# Patient Record
Sex: Female | Born: 1975 | Race: White | Hispanic: No | Marital: Married | Smoking: Never smoker
Health system: Southern US, Community
[De-identification: ages and names within clinical notes are randomized; demographics above are authoritative.]

## PROBLEM LIST (undated history)

## (undated) ENCOUNTER — Inpatient Hospital Stay (HOSPITAL_COMMUNITY): Payer: Self-pay

## (undated) ENCOUNTER — Inpatient Hospital Stay (HOSPITAL_COMMUNITY)

## (undated) ENCOUNTER — Inpatient Hospital Stay (HOSPITAL_COMMUNITY): Payer: BLUE CROSS/BLUE SHIELD

## (undated) DIAGNOSIS — Z8489 Family history of other specified conditions: Secondary | ICD-10-CM

## (undated) DIAGNOSIS — F32A Depression, unspecified: Secondary | ICD-10-CM

## (undated) DIAGNOSIS — R55 Syncope and collapse: Secondary | ICD-10-CM

## (undated) DIAGNOSIS — Z8619 Personal history of other infectious and parasitic diseases: Secondary | ICD-10-CM

## (undated) DIAGNOSIS — Z9889 Other specified postprocedural states: Secondary | ICD-10-CM

## (undated) DIAGNOSIS — I10 Essential (primary) hypertension: Secondary | ICD-10-CM

## (undated) DIAGNOSIS — R519 Headache, unspecified: Secondary | ICD-10-CM

## (undated) DIAGNOSIS — R002 Palpitations: Secondary | ICD-10-CM

## (undated) DIAGNOSIS — O09529 Supervision of elderly multigravida, unspecified trimester: Secondary | ICD-10-CM

## (undated) DIAGNOSIS — R011 Cardiac murmur, unspecified: Secondary | ICD-10-CM

## (undated) DIAGNOSIS — K429 Umbilical hernia without obstruction or gangrene: Secondary | ICD-10-CM

## (undated) DIAGNOSIS — R51 Headache: Secondary | ICD-10-CM

## (undated) DIAGNOSIS — F329 Major depressive disorder, single episode, unspecified: Secondary | ICD-10-CM

## (undated) HISTORY — DX: Syncope and collapse: R55

## (undated) HISTORY — PX: WISDOM TOOTH EXTRACTION: SHX21

## (undated) HISTORY — DX: Palpitations: R00.2

## (undated) HISTORY — PX: DILATION AND CURETTAGE OF UTERUS: SHX78

## (undated) HISTORY — PX: ABLATION: SHX5711

---

## 2010-04-15 ENCOUNTER — Ambulatory Visit: Payer: Self-pay | Admitting: Cardiology

## 2010-04-21 ENCOUNTER — Ambulatory Visit (HOSPITAL_COMMUNITY): Admission: RE | Admit: 2010-04-21 | Discharge: 2010-04-21 | Payer: Self-pay | Admitting: Cardiology

## 2010-04-21 ENCOUNTER — Ambulatory Visit: Payer: Self-pay | Admitting: Cardiology

## 2010-05-18 ENCOUNTER — Ambulatory Visit: Payer: Self-pay | Admitting: Cardiology

## 2011-10-10 ENCOUNTER — Other Ambulatory Visit: Payer: Self-pay | Admitting: Gynecology

## 2011-10-10 DIAGNOSIS — R928 Other abnormal and inconclusive findings on diagnostic imaging of breast: Secondary | ICD-10-CM

## 2011-10-11 ENCOUNTER — Ambulatory Visit
Admission: RE | Admit: 2011-10-11 | Discharge: 2011-10-11 | Disposition: A | Discharge status: Home / Self Care | Payer: 59 | Source: Ambulatory Visit | Attending: Gynecology | Admitting: Gynecology

## 2011-10-11 ENCOUNTER — Other Ambulatory Visit: Payer: Self-pay | Admitting: Gynecology

## 2011-10-11 DIAGNOSIS — R928 Other abnormal and inconclusive findings on diagnostic imaging of breast: Secondary | ICD-10-CM

## 2011-10-11 DIAGNOSIS — N6310 Unspecified lump in the right breast, unspecified quadrant: Secondary | ICD-10-CM

## 2011-10-14 ENCOUNTER — Other Ambulatory Visit: Payer: Self-pay | Admitting: Gynecology

## 2011-10-14 ENCOUNTER — Ambulatory Visit
Admission: RE | Admit: 2011-10-14 | Discharge: 2011-10-14 | Disposition: A | Discharge status: Home / Self Care | Payer: 59 | Source: Ambulatory Visit | Attending: Gynecology | Admitting: Gynecology

## 2011-10-14 DIAGNOSIS — N6310 Unspecified lump in the right breast, unspecified quadrant: Secondary | ICD-10-CM

## 2011-10-14 HISTORY — PX: BREAST BIOPSY: SHX20

## 2011-12-16 LAB — OB RESULTS CONSOLE HIV ANTIBODY (ROUTINE TESTING): HIV: NONREACTIVE

## 2011-12-16 LAB — OB RESULTS CONSOLE ABO/RH

## 2011-12-16 LAB — OB RESULTS CONSOLE ANTIBODY SCREEN: Antibody Screen: NEGATIVE

## 2011-12-16 LAB — OB RESULTS CONSOLE GC/CHLAMYDIA: Gonorrhea: NEGATIVE

## 2012-05-21 ENCOUNTER — Other Ambulatory Visit: Payer: Self-pay | Admitting: Gynecology

## 2012-05-21 DIAGNOSIS — N63 Unspecified lump in unspecified breast: Secondary | ICD-10-CM

## 2012-05-30 ENCOUNTER — Ambulatory Visit
Admission: RE | Admit: 2012-05-30 | Discharge: 2012-05-30 | Disposition: A | Discharge status: Home / Self Care | Payer: 59 | Source: Ambulatory Visit | Attending: Gynecology | Admitting: Gynecology

## 2012-05-30 DIAGNOSIS — N63 Unspecified lump in unspecified breast: Secondary | ICD-10-CM

## 2012-06-19 LAB — OB RESULTS CONSOLE GBS: GBS: POSITIVE

## 2012-06-20 ENCOUNTER — Encounter: Payer: Self-pay | Admitting: Cardiology

## 2012-07-21 ENCOUNTER — Inpatient Hospital Stay (HOSPITAL_COMMUNITY)
Admission: AD | Admit: 2012-07-21 | Discharge: 2012-07-24 | DRG: 766 | Disposition: A | Discharge status: Home / Self Care | Payer: 59 | Source: Ambulatory Visit | Attending: Obstetrics and Gynecology | Admitting: Obstetrics and Gynecology

## 2012-07-21 ENCOUNTER — Encounter (HOSPITAL_COMMUNITY): Payer: Self-pay

## 2012-07-21 DIAGNOSIS — O328XX0 Maternal care for other malpresentation of fetus, not applicable or unspecified: Principal | ICD-10-CM | POA: Diagnosis present

## 2012-07-21 DIAGNOSIS — O99892 Other specified diseases and conditions complicating childbirth: Secondary | ICD-10-CM | POA: Diagnosis present

## 2012-07-21 DIAGNOSIS — O09519 Supervision of elderly primigravida, unspecified trimester: Secondary | ICD-10-CM | POA: Diagnosis present

## 2012-07-21 DIAGNOSIS — O4290 Premature rupture of membranes, unspecified as to length of time between rupture and onset of labor, unspecified weeks of gestation: Secondary | ICD-10-CM | POA: Diagnosis present

## 2012-07-21 DIAGNOSIS — Z22330 Carrier of Group B streptococcus: Secondary | ICD-10-CM

## 2012-07-21 LAB — CBC
HCT: 35.4 % — ABNORMAL LOW (ref 36.0–46.0)
Platelets: 207 10*3/uL (ref 150–400)
RDW: 13.2 % (ref 11.5–15.5)
WBC: 13.6 10*3/uL — ABNORMAL HIGH (ref 4.0–10.5)

## 2012-07-21 MED ORDER — LIDOCAINE HCL (PF) 1 % IJ SOLN: 30.0000 mL | INTRAMUSCULAR | Status: DC | PRN

## 2012-07-21 MED ORDER — CITRIC ACID-SODIUM CITRATE 334-500 MG/5ML PO SOLN
30.0000 mL | ORAL | Status: DC | PRN
  Administered 2012-07-22: 30 mL via ORAL
  Filled 2012-07-21: qty 15

## 2012-07-21 MED ORDER — PENICILLIN G POTASSIUM 5000000 UNITS IJ SOLR
2.5000 10*6.[IU] | INTRAMUSCULAR | Status: DC
  Administered 2012-07-21 – 2012-07-22 (×4): 2.5 10*6.[IU] via INTRAVENOUS
  Filled 2012-07-21 (×6): qty 2.5

## 2012-07-21 MED ORDER — PENICILLIN G POTASSIUM 5000000 UNITS IJ SOLR
5.0000 10*6.[IU] | Freq: Once | INTRAVENOUS | Status: AC
  Administered 2012-07-21: 5 10*6.[IU] via INTRAVENOUS
  Filled 2012-07-21: qty 5

## 2012-07-21 MED ORDER — IBUPROFEN 600 MG PO TABS: 600.0000 mg | ORAL_TABLET | Freq: Four times a day (QID) | ORAL | Status: DC | PRN

## 2012-07-21 MED ORDER — LACTATED RINGERS IV SOLN
INTRAVENOUS | Status: DC
  Administered 2012-07-21: 16:00:00 via INTRAVENOUS

## 2012-07-21 MED ORDER — BUTORPHANOL TARTRATE 1 MG/ML IJ SOLN: 1.0000 mg | INTRAMUSCULAR | Status: DC | PRN

## 2012-07-21 MED ORDER — OXYTOCIN 40 UNITS IN LACTATED RINGERS INFUSION - SIMPLE MED: 62.5000 mL/h | INTRAVENOUS | Status: DC

## 2012-07-21 MED ORDER — ACETAMINOPHEN 325 MG PO TABS: 650.0000 mg | ORAL_TABLET | ORAL | Status: DC | PRN

## 2012-07-21 MED ORDER — OXYTOCIN 40 UNITS IN LACTATED RINGERS INFUSION - SIMPLE MED
1.0000 m[IU]/min | INTRAVENOUS | Status: DC
  Administered 2012-07-21: 2 m[IU]/min via INTRAVENOUS
  Filled 2012-07-21: qty 1000

## 2012-07-21 MED ORDER — ONDANSETRON HCL 4 MG/2ML IJ SOLN: 4.0000 mg | Freq: Four times a day (QID) | INTRAMUSCULAR | Status: DC | PRN

## 2012-07-21 MED ORDER — OXYCODONE-ACETAMINOPHEN 5-325 MG PO TABS: 1.0000 | ORAL_TABLET | ORAL | Status: DC | PRN

## 2012-07-21 MED ORDER — OXYTOCIN BOLUS FROM INFUSION: 500.0000 mL | INTRAVENOUS | Status: DC

## 2012-07-21 MED ORDER — LACTATED RINGERS IV SOLN: 500.0000 mL | INTRAVENOUS | Status: DC | PRN

## 2012-07-21 MED ORDER — TERBUTALINE SULFATE 1 MG/ML IJ SOLN: 0.2500 mg | Freq: Once | INTRAMUSCULAR | Status: AC | PRN

## 2012-07-21 MED ORDER — FLEET ENEMA 7-19 GM/118ML RE ENEM: 1.0000 | ENEMA | Freq: Every day | RECTAL | Status: DC | PRN

## 2012-07-21 NOTE — Progress Notes (Signed)
Pt reports to L+D with c/o SROM at midnight last night.  Reports clear fluid at first but now, light green noted on pads. SVE completed and fern slide positive. Pt with birth plan and reviewed with pt, FOB and doula.  The need for an IV discussed due to GBS status and pt in agreement.  Desires heplock between ABX dosing. Pt expressed fear of pitocin usage during labor due to an article she found online stating pitocin causes autism.  Education given to pt and family regarding pitocin policy here at [facility].  Dr Rana Snare made aware.

## 2012-07-21 NOTE — MAU Note (Signed)
Patient states she started leaking clear fluid at 0015. States the fluid now has a "murky" greenish color. States she was having contractions earlier but none now. Reports good fetal movement.

## 2012-07-21 NOTE — Progress Notes (Signed)
Pt desires to be off monitors.

## 2012-07-21 NOTE — H&P (Signed)
Kristin Kristin Church is a 36 y.o. female presenting for PROM at Midnight.  Presented at 1400 with Doula and birthing plan.  GBS+ and otherwise normal pregnancy. History OB History    Grav Para Term Preterm Abortions TAB SAB Ect Mult Living   1              Past Medical History  Diagnosis Date  . Palpitations     Of questionable significance  . Pre-syncope     Probably multifactorial and long standing in duration   No past surgical history on file. Family History: family history includes Arthritis in her mother; Hypertension in her father and mother; and Mitral valve prolapse in her mother. Social History:  reports that she has never smoked. She does not have any smokeless tobacco history on file. She reports that she drinks alcohol. Her drug history not on file.   Prenatal Transfer Tool  Maternal Diabetes: No Genetic Screening: Normal Maternal Ultrasounds/Referrals: Normal Fetal Ultrasounds or other Referrals:  None Maternal Substance Abuse:  No Significant Maternal Medications:  None Significant Maternal Lab Results:  None Other Comments:  None  ROS  Dilation: 1 Effacement (%): 80 Station: -2 Exam by:: S Earl RN Blood pressure 120/75, pulse 73, temperature 98.5 F (36.9 Kristin Church), temperature source Oral, resp. rate 18, height 5\' 2"  (1.575 m), weight 78.926 kg (174 lb), SpO2 100.00%. Exam Physical Exam  Per RN positive fern and light meconium Prenatal labs: ABO, Rh:   Antibody:   Rubella:   RPR:    HBsAg:    HIV:    GBS:     Assessment/Plan: IUP at  40 3/7 PROM now 17 hours ago.  Meconium and GBS + Pt at first reluctant for IV and allowed for Antibiotics for GBS I had a lengthy discussion with patient, her husband, Doula and with RN present regarding my recommendations for pitocin for induction/augmentation of labor.  Pt is concerned about the use of pitocin and a link to autism in something she read.  I researched and found no link in peer reviewed journals and tried to  reassure the patient.  She is now willing to begin pitocin and antibiotics and understands that she is at risk for chorio, Kristin Kristin Church, sepsis due to PROM, but at this time there is no evidence of any fetal compromise. Kristin Kristin Church 07/21/2012, 5:26 PM

## 2012-07-21 NOTE — MAU Note (Signed)
Patient has a pad on with greenish fluid. Spoke with Urban Gibson, CN in BS and patient will go to room 162.

## 2012-07-22 ENCOUNTER — Inpatient Hospital Stay (HOSPITAL_COMMUNITY): Payer: 59 | Admitting: Anesthesiology

## 2012-07-22 ENCOUNTER — Encounter (HOSPITAL_COMMUNITY): Payer: Self-pay | Admitting: Anesthesiology

## 2012-07-22 ENCOUNTER — Encounter (HOSPITAL_COMMUNITY)
Admission: AD | Disposition: A | Discharge status: Home / Self Care | Payer: Self-pay | Source: Ambulatory Visit | Attending: Obstetrics and Gynecology

## 2012-07-22 ENCOUNTER — Encounter (HOSPITAL_COMMUNITY): Payer: Self-pay | Admitting: *Deleted

## 2012-07-22 SURGERY — Surgical Case
Anesthesia: Spinal

## 2012-07-22 MED ORDER — DIPHENHYDRAMINE HCL 25 MG PO CAPS: 25.0000 mg | ORAL_CAPSULE | ORAL | Status: DC | PRN

## 2012-07-22 MED ORDER — SCOPOLAMINE 1 MG/3DAYS TD PT72
1.0000 | MEDICATED_PATCH | Freq: Once | TRANSDERMAL | Status: DC
  Administered 2012-07-22: 1.5 mg via TRANSDERMAL

## 2012-07-22 MED ORDER — DIPHENHYDRAMINE HCL 50 MG/ML IJ SOLN: 12.5000 mg | INTRAMUSCULAR | Status: DC | PRN

## 2012-07-22 MED ORDER — SCOPOLAMINE 1 MG/3DAYS TD PT72
MEDICATED_PATCH | TRANSDERMAL | Status: AC
  Filled 2012-07-22: qty 1

## 2012-07-22 MED ORDER — NALBUPHINE SYRINGE 5 MG/0.5 ML
5.0000 mg | INJECTION | INTRAMUSCULAR | Status: DC | PRN
  Filled 2012-07-22: qty 1

## 2012-07-22 MED ORDER — ONDANSETRON HCL 4 MG/2ML IJ SOLN: 4.0000 mg | Freq: Three times a day (TID) | INTRAMUSCULAR | Status: DC | PRN

## 2012-07-22 MED ORDER — SIMETHICONE 80 MG PO CHEW: 80.0000 mg | CHEWABLE_TABLET | ORAL | Status: DC | PRN

## 2012-07-22 MED ORDER — KETOROLAC TROMETHAMINE 30 MG/ML IJ SOLN
INTRAMUSCULAR | Status: AC
  Filled 2012-07-22: qty 1

## 2012-07-22 MED ORDER — PRENATAL MULTIVITAMIN CH: 1.0000 | ORAL_TABLET | Freq: Every day | ORAL | Status: DC

## 2012-07-22 MED ORDER — DIPHENHYDRAMINE HCL 50 MG/ML IJ SOLN: 25.0000 mg | INTRAMUSCULAR | Status: DC | PRN

## 2012-07-22 MED ORDER — IBUPROFEN 600 MG PO TABS: 600.0000 mg | ORAL_TABLET | Freq: Four times a day (QID) | ORAL | Status: DC

## 2012-07-22 MED ORDER — BUPIVACAINE IN DEXTROSE 0.75-8.25 % IT SOLN
INTRATHECAL | Status: DC | PRN
  Administered 2012-07-22: 1.5 mL via INTRATHECAL

## 2012-07-22 MED ORDER — OXYTOCIN 40 UNITS IN LACTATED RINGERS INFUSION - SIMPLE MED: 62.5000 mL/h | INTRAVENOUS | Status: AC

## 2012-07-22 MED ORDER — TETANUS-DIPHTH-ACELL PERTUSSIS 5-2.5-18.5 LF-MCG/0.5 IM SUSP: 0.5000 mL | Freq: Once | INTRAMUSCULAR | Status: DC

## 2012-07-22 MED ORDER — KETOROLAC TROMETHAMINE 30 MG/ML IJ SOLN
30.0000 mg | Freq: Four times a day (QID) | INTRAMUSCULAR | Status: AC | PRN
  Administered 2012-07-22 – 2012-07-23 (×2): 30 mg via INTRAVENOUS
  Filled 2012-07-22: qty 1

## 2012-07-22 MED ORDER — FENTANYL CITRATE 0.05 MG/ML IJ SOLN: 25.0000 ug | INTRAMUSCULAR | Status: DC | PRN

## 2012-07-22 MED ORDER — METOCLOPRAMIDE HCL 5 MG/ML IJ SOLN: 10.0000 mg | Freq: Three times a day (TID) | INTRAMUSCULAR | Status: DC | PRN

## 2012-07-22 MED ORDER — DEXTROSE 5 % IV SOLN
2.0000 g | Freq: Once | INTRAVENOUS | Status: AC
  Administered 2012-07-22: 2 g via INTRAVENOUS
  Filled 2012-07-22: qty 2

## 2012-07-22 MED ORDER — PROMETHAZINE HCL 25 MG/ML IJ SOLN: 6.2500 mg | INTRAMUSCULAR | Status: DC | PRN

## 2012-07-22 MED ORDER — KETOROLAC TROMETHAMINE 30 MG/ML IJ SOLN: 30.0000 mg | Freq: Four times a day (QID) | INTRAMUSCULAR | Status: AC | PRN

## 2012-07-22 MED ORDER — SODIUM CHLORIDE 0.9 % IJ SOLN: 3.0000 mL | INTRAMUSCULAR | Status: DC | PRN

## 2012-07-22 MED ORDER — OXYCODONE-ACETAMINOPHEN 5-325 MG PO TABS
1.0000 | ORAL_TABLET | ORAL | Status: DC | PRN
  Administered 2012-07-24: 1 via ORAL
  Filled 2012-07-22: qty 1

## 2012-07-22 MED ORDER — FENTANYL CITRATE 0.05 MG/ML IJ SOLN
INTRAMUSCULAR | Status: DC | PRN
  Administered 2012-07-22: 25 ug via INTRATHECAL

## 2012-07-22 MED ORDER — PHENYLEPHRINE HCL 10 MG/ML IJ SOLN
INTRAMUSCULAR | Status: DC | PRN
  Administered 2012-07-22: 80 ug via INTRAVENOUS

## 2012-07-22 MED ORDER — DIBUCAINE 1 % RE OINT: 1.0000 "application " | TOPICAL_OINTMENT | RECTAL | Status: DC | PRN

## 2012-07-22 MED ORDER — OXYTOCIN 10 UNIT/ML IJ SOLN
INTRAMUSCULAR | Status: AC
  Filled 2012-07-22: qty 4

## 2012-07-22 MED ORDER — ZOLPIDEM TARTRATE 5 MG PO TABS: 5.0000 mg | ORAL_TABLET | Freq: Every evening | ORAL | Status: DC | PRN

## 2012-07-22 MED ORDER — LANOLIN HYDROUS EX OINT: 1.0000 "application " | TOPICAL_OINTMENT | CUTANEOUS | Status: DC | PRN

## 2012-07-22 MED ORDER — DIPHENHYDRAMINE HCL 25 MG PO CAPS: 25.0000 mg | ORAL_CAPSULE | Freq: Four times a day (QID) | ORAL | Status: DC | PRN

## 2012-07-22 MED ORDER — MORPHINE SULFATE (PF) 0.5 MG/ML IJ SOLN
INTRAMUSCULAR | Status: DC | PRN
  Administered 2012-07-22: .1 mg via INTRATHECAL

## 2012-07-22 MED ORDER — ONDANSETRON HCL 4 MG/2ML IJ SOLN
INTRAMUSCULAR | Status: DC | PRN
  Administered 2012-07-22: 4 mg via INTRAVENOUS

## 2012-07-22 MED ORDER — MENTHOL 3 MG MT LOZG: 1.0000 | LOZENGE | OROMUCOSAL | Status: DC | PRN

## 2012-07-22 MED ORDER — OXYTOCIN 10 UNIT/ML IJ SOLN
40.0000 [IU] | INTRAVENOUS | Status: DC | PRN
  Administered 2012-07-22: 40 [IU] via INTRAVENOUS

## 2012-07-22 MED ORDER — MIDAZOLAM HCL 2 MG/2ML IJ SOLN: 0.5000 mg | Freq: Once | INTRAMUSCULAR | Status: DC | PRN

## 2012-07-22 MED ORDER — ONDANSETRON HCL 4 MG/2ML IJ SOLN
INTRAMUSCULAR | Status: AC
  Filled 2012-07-22: qty 2

## 2012-07-22 MED ORDER — MEPERIDINE HCL 25 MG/ML IJ SOLN: 6.2500 mg | INTRAMUSCULAR | Status: DC | PRN

## 2012-07-22 MED ORDER — WITCH HAZEL-GLYCERIN EX PADS: 1.0000 "application " | MEDICATED_PAD | CUTANEOUS | Status: DC | PRN

## 2012-07-22 MED ORDER — ONDANSETRON HCL 4 MG/2ML IJ SOLN: 4.0000 mg | INTRAMUSCULAR | Status: DC | PRN

## 2012-07-22 MED ORDER — SIMETHICONE 80 MG PO CHEW
80.0000 mg | CHEWABLE_TABLET | Freq: Three times a day (TID) | ORAL | Status: DC
  Administered 2012-07-23 – 2012-07-24 (×4): 80 mg via ORAL

## 2012-07-22 MED ORDER — ACETAMINOPHEN 10 MG/ML IV SOLN
1000.0000 mg | Freq: Four times a day (QID) | INTRAVENOUS | Status: AC | PRN
  Filled 2012-07-22: qty 100

## 2012-07-22 MED ORDER — SENNOSIDES-DOCUSATE SODIUM 8.6-50 MG PO TABS: 2.0000 | ORAL_TABLET | Freq: Every day | ORAL | Status: DC

## 2012-07-22 MED ORDER — LACTATED RINGERS IV SOLN
INTRAVENOUS | Status: DC
  Administered 2012-07-22: 23:00:00 via INTRAVENOUS

## 2012-07-22 MED ORDER — LACTATED RINGERS IV SOLN
INTRAVENOUS | Status: DC | PRN
  Administered 2012-07-22 (×2): via INTRAVENOUS

## 2012-07-22 MED ORDER — MORPHINE SULFATE 0.5 MG/ML IJ SOLN
INTRAMUSCULAR | Status: AC
  Filled 2012-07-22: qty 10

## 2012-07-22 MED ORDER — PRENATAL MULTIVITAMIN CH
1.0000 | ORAL_TABLET | Freq: Every day | ORAL | Status: DC
  Administered 2012-07-23 – 2012-07-24 (×2): 1 via ORAL
  Filled 2012-07-22 (×2): qty 1

## 2012-07-22 MED ORDER — FENTANYL CITRATE 0.05 MG/ML IJ SOLN
INTRAMUSCULAR | Status: AC
  Filled 2012-07-22: qty 2

## 2012-07-22 MED ORDER — NALOXONE HCL 0.4 MG/ML IJ SOLN: 0.4000 mg | INTRAMUSCULAR | Status: DC | PRN

## 2012-07-22 MED ORDER — NALOXONE HCL 1 MG/ML IJ SOLN
1.0000 ug/kg/h | INTRAVENOUS | Status: DC | PRN
  Filled 2012-07-22: qty 2

## 2012-07-22 MED ORDER — ONDANSETRON HCL 4 MG PO TABS: 4.0000 mg | ORAL_TABLET | ORAL | Status: DC | PRN

## 2012-07-22 SURGICAL SUPPLY — 32 items
CLOTH BEACON ORANGE TIMEOUT ST (SAFETY) ×2 IMPLANT
DRESSING TELFA 8X3 (GAUZE/BANDAGES/DRESSINGS) IMPLANT
DRSG COVADERM 4X10 (GAUZE/BANDAGES/DRESSINGS) ×2 IMPLANT
DRSG OPSITE POSTOP 4X10 (GAUZE/BANDAGES/DRESSINGS) IMPLANT
DURAPREP 26ML APPLICATOR (WOUND CARE) ×2 IMPLANT
ELECT REM PT RETURN 9FT ADLT (ELECTROSURGICAL) ×2
ELECTRODE REM PT RTRN 9FT ADLT (ELECTROSURGICAL) ×1 IMPLANT
EXTRACTOR VACUUM M CUP 4 TUBE (SUCTIONS) IMPLANT
GAUZE SPONGE 4X4 12PLY STRL LF (GAUZE/BANDAGES/DRESSINGS) IMPLANT
GLOVE BIO SURGEON STRL SZ8 (GLOVE) ×2 IMPLANT
GLOVE SURG ORTHO 8.0 STRL STRW (GLOVE) ×2 IMPLANT
GLOVE SURG SS PI 7.5 STRL IVOR (GLOVE) ×4 IMPLANT
GOWN PREVENTION PLUS LG XLONG (DISPOSABLE) ×4 IMPLANT
KIT ABG SYR 3ML LUER SLIP (SYRINGE) ×2 IMPLANT
NEEDLE HYPO 25X5/8 SAFETYGLIDE (NEEDLE) ×2 IMPLANT
NS IRRIG 1000ML POUR BTL (IV SOLUTION) ×2 IMPLANT
PACK C SECTION WH (CUSTOM PROCEDURE TRAY) ×2 IMPLANT
PAD ABD 7.5X8 STRL (GAUZE/BANDAGES/DRESSINGS) IMPLANT
PAD OB MATERNITY 4.3X12.25 (PERSONAL CARE ITEMS) ×2 IMPLANT
SLEEVE SCD COMPRESS KNEE MED (MISCELLANEOUS) IMPLANT
STAPLER VISISTAT 35W (STAPLE) ×2 IMPLANT
SUT MNCRL 0 VIOLET CTX 36 (SUTURE) ×3 IMPLANT
SUT MON AB 2-0 CT1 27 (SUTURE) ×2 IMPLANT
SUT MONOCRYL 0 CTX 36 (SUTURE) ×3
SUT PDS AB 1 CT  36 (SUTURE)
SUT PDS AB 1 CT 36 (SUTURE) IMPLANT
SUT VIC AB 1 CT1 36 (SUTURE) ×4 IMPLANT
SUT VIC AB 1 CTX 36 (SUTURE)
SUT VIC AB 1 CTX36XBRD ANBCTRL (SUTURE) IMPLANT
TOWEL OR 17X24 6PK STRL BLUE (TOWEL DISPOSABLE) ×4 IMPLANT
TRAY FOLEY CATH 14FR (SET/KITS/TRAYS/PACK) ×2 IMPLANT
WATER STERILE IRR 1000ML POUR (IV SOLUTION) ×2 IMPLANT

## 2012-07-22 NOTE — Progress Notes (Signed)
Patient ID: Kristin Church, female   DOB: 07/30/1976, 36 y.o.   MRN: [id number] Pt and RN called at 345 am and stated "she's done trying and wants a C/S" Pt was tired, FHR reactive, and cervix 5 cm I responded to RN to stop the Pitocin and recheck in 1-2 hours to see if she's progessing or if FHR unstable  Pt refused recheck and wanted C/S  I had a lenghty discussion with pt regarding her previous desires for vag delivery and she states she's frustrated that the baby has not dropped.  She agreed to let me check her.  Cx 4/90/very high with question vtx/arm or hand [place] in room confirms compound presentation  PROM/compound presentation - plan primary C/S OR now has 3 cases ahead of her and patient is stable/afebrile and fhr reassuring This was discussed with family and they understand Risks and benefits of C/S were discussed.  All questions were answered and informed consent was obtained.  Plan to proceed with low segment transverse Cesarean Section.

## 2012-07-22 NOTE — Progress Notes (Addendum)
Pt requesting C/S.  Dr Rana Snare at bedside and reviewed options of care with pt and family.  Pt continues to desire C/S for delivery.  SVE unchanged.  Bedside US confirms vertex with compound presentation  Consent obtained.

## 2012-07-22 NOTE — Anesthesia Postprocedure Evaluation (Signed)
Anesthesia Post Note  Patient: Kristin Church  Procedure(s) Performed: Procedure(s) (LRB): CESAREAN SECTION (N/A)  Anesthesia type: Spinal  Patient location: PACU  Post pain: Pain level controlled  Post assessment: Post-op Vital signs reviewed  Last Vitals:  Filed Vitals:   07/22/12 1330  BP: 107/61  Pulse: 64  Temp:   Resp: 17    Post vital signs: Reviewed  Level of consciousness: awake  Complications: No apparent anesthesia complications

## 2012-07-22 NOTE — Anesthesia Procedure Notes (Signed)
Spinal  Patient location during procedure: OR Start time: 07/22/2012 12:45 PM Staffing Anesthesiologist: Brayton Caves R Performed by: anesthesiologist  Preanesthetic Checklist Completed: patient identified, site marked, surgical consent, pre-op evaluation, timeout performed, IV checked, risks and benefits discussed and monitors and equipment checked Spinal Block Patient position: sitting Prep: DuraPrep Patient monitoring: heart rate, cardiac monitor, continuous pulse ox and blood pressure Approach: midline Location: L3-4 Injection technique: single-shot Needle Needle type: Sprotte  Needle gauge: 24 G Needle length: 9 cm Assessment Sensory level: T4 Additional Notes Patient identified.  Risk benefits discussed including failed block, incomplete pain control, headache, nerve damage, paralysis, blood pressure changes, nausea, vomiting, reactions to medication both toxic or allergic, and postpartum back pain.  Patient expressed understanding and wished to proceed.  All questions were answered.  Sterile technique used throughout procedure.  CSF was clear.  No parasthesia or other complications.  Please see nursing notes for vital signs.

## 2012-07-22 NOTE — Transfer of Care (Signed)
Immediate Anesthesia Transfer of Care Note  Patient: Kristin Church  Procedure(s) Performed: Procedure(s) (LRB) with comments: CESAREAN SECTION (N/A)  Patient Location: PACU  Anesthesia Type:Spinal  Level of Consciousness: awake, alert  and oriented  Airway & Oxygen Therapy: Patient Spontanous Breathing  Post-op Assessment: Report given to PACU RN and Post -op Vital signs reviewed and stable  Post vital signs: stable  Complications: No apparent anesthesia complications

## 2012-07-22 NOTE — Anesthesia Preprocedure Evaluation (Signed)
Anesthesia Evaluation  Patient identified by MRN, date of birth, ID band Patient awake    Reviewed: Allergy & Precautions, H&P , NPO status , Patient's Chart, lab work & pertinent test results  Airway Mallampati: II      Dental No notable dental hx.    Pulmonary neg pulmonary ROS,  breath sounds clear to auscultation  Pulmonary exam normal       Cardiovascular Exercise Tolerance: Good negative cardio ROS  Rhythm:regular Rate:Normal     Neuro/Psych negative neurological ROS  negative psych ROS   GI/Hepatic negative GI ROS, Neg liver ROS,   Endo/Other  negative endocrine ROS  Renal/GU negative Renal ROS  negative genitourinary   Musculoskeletal   Abdominal Normal abdominal exam  (+)   Peds  Hematology negative hematology ROS (+)   Anesthesia Other Findings Palpitations   Of questionable significance Pre-syncope   Probably multifactorial and long standing in duration   Reproductive/Obstetrics (+) Pregnancy                           Anesthesia Physical Anesthesia Plan  ASA: II and emergent  Anesthesia Plan: Spinal   Post-op Pain Management:    Induction:   Airway Management Planned:   Additional Equipment:   Intra-op Plan:   Post-operative Plan:   Informed Consent: I have reviewed the patients History and Physical, chart, labs and discussed the procedure including the risks, benefits and alternatives for the proposed anesthesia with the patient or authorized representative who has indicated his/her understanding and acceptance.     Plan Discussed with: Anesthesiologist, CRNA and Surgeon  Anesthesia Plan Comments:         Anesthesia Quick Evaluation

## 2012-07-22 NOTE — Op Note (Signed)
Cesarean Section Procedure Note  Pre-operative Diagnosis: IUP at 40 + weeks, PROM, Meconium, Compound presentation  Post-operative Diagnosis: same  Surgeon: Turner Daniels   Assistants: none  Anesthesia:spinal  Procedure:  Low Segment Transverse cesarean section  Procedure Details  The patient was seen in the Holding Room. The risks, benefits, complications, treatment options, and expected outcomes were discussed with the patient.  The patient concurred with the proposed plan, giving informed consent.  The site of surgery properly noted/marked.. A Time Out was held and the above information confirmed.  After induction of anesthesia, the patient was draped and prepped in the usual sterile manner. A Pfannenstiel incision was made and carried down through the subcutaneous tissue to the fascia. Fascial incision was made and extended transversely. The fascia was separated from the underlying rectus tissue superiorly and inferiorly. The peritoneum was identified and entered. Peritoneal incision was extended longitudinally. The utero-vesical peritoneal reflection was incised transversely and the bladder flap was bluntly freed from the lower uterine segment. A low transverse uterine incision was made. Delivered from vertex presentation was a baby with Apgar scores of 9 at one minute and 9 at five minutes. After the umbilical cord was clamped and cut cord blood was obtained for evaluation. The placenta was removed intact and appeared normal. The uterine outline, tubes and ovaries appeared normal. The uterine incision was closed with running locked sutures of 0 monocryl and imbricated with 0 monocryl. Hemostasis was observed. Lavage was carried out until clear. The peritoneum was then closed with 0 monocryl and rectus muscles plicated in the midline.  After hemostasis was assured, the fascia was then reapproximated with running sutures of 0 Vicryl. Irrigation was applied and after adequate hemostasis was  assured, the skin was reapproximated with staples.  Instrument, sponge, and needle counts were correct prior the abdominal closure and at the conclusion of the case. The patient received 2 grams cefotetan preoperatively.  Findings: Viable female, ph art 7.32  Estimated Blood Loss:  700cc         Specimens: Placenta was sent to L&D         Complications:  None

## 2012-07-23 ENCOUNTER — Encounter (HOSPITAL_COMMUNITY): Payer: Self-pay | Admitting: Obstetrics and Gynecology

## 2012-07-23 LAB — CBC
HCT: 27.1 % — ABNORMAL LOW (ref 36.0–46.0)
Hemoglobin: 9.1 g/dL — ABNORMAL LOW (ref 12.0–15.0)
MCHC: 33.6 g/dL (ref 30.0–36.0)

## 2012-07-23 MED ORDER — IBUPROFEN 800 MG PO TABS
800.0000 mg | ORAL_TABLET | Freq: Three times a day (TID) | ORAL | Status: DC | PRN
  Administered 2012-07-23 – 2012-07-24 (×3): 800 mg via ORAL
  Filled 2012-07-23 (×3): qty 1

## 2012-07-23 NOTE — Anesthesia Postprocedure Evaluation (Signed)
  Anesthesia Post-op Note  Patient: Kristin Church  Procedure(s) Performed: Procedure(s) (LRB) with comments: CESAREAN SECTION (N/A)  Patient Location: Mother/Baby  Anesthesia Type:Spinal  Level of Consciousness: awake  Airway and Oxygen Therapy: Patient Spontanous Breathing  Post-op Pain: none  Post-op Assessment: Patient's Cardiovascular Status Stable, Respiratory Function Stable, Patent Airway, No signs of Nausea or vomiting, Adequate PO intake, Pain level controlled, No headache, No backache, No residual numbness and No residual motor weakness  Post-op Vital Signs: Reviewed and stable  Complications: No apparent anesthesia complications

## 2012-07-23 NOTE — Progress Notes (Signed)
Subjective: Postpartum Day 1: Cesarean Delivery Patient reports tolerating PO.    Objective: Vital signs in last 24 hours: Temp:  [97.6 F (36.4 C)-99.5 F (37.5 C)] 98.8 F (37.1 C) (12/09 0830) Pulse Rate:  [50-81] 66  (12/09 0830) Resp:  [11-24] 20  (12/09 0830) BP: (95-127)/(59-80) 104/60 mmHg (12/09 0830) SpO2:  [97 %-100 %] 98 % (12/09 0430)  Physical Exam:  General: alert and cooperative Lochia: appropriate Uterine Fundus: firm Incision: abd dressing with small old drainage noted on bandage DVT Evaluation: No evidence of DVT seen on physical exam. No significant calf/ankle edema. Foley removed, has not voided at the time of rounding   Basename 07/23/12 0525 07/21/12 1615  HGB 9.1* 12.1  HCT 27.1* 35.4*    Assessment/Plan: Status post Cesarean section. Doing well postoperatively.  Continue current care.  Beza Steppe G 07/23/2012, 9:13 AM

## 2012-07-24 MED ORDER — IBUPROFEN 800 MG PO TABS: 800.0000 mg | ORAL_TABLET | Freq: Three times a day (TID) | ORAL | Status: DC | PRN

## 2012-07-24 MED ORDER — OXYCODONE-ACETAMINOPHEN 5-325 MG PO TABS: 1.0000 | ORAL_TABLET | ORAL | Status: DC | PRN

## 2012-07-24 NOTE — Discharge Summary (Signed)
Obstetric Discharge Summary Reason for Admission: rupture of membranes Prenatal Procedures: ultrasound Intrapartum Procedures: cesarean: low cervical, transverse Postpartum Procedures: none Complications-Operative and Postpartum: none Hemoglobin  Date Value Range Status  07/23/2012 9.1* 12.0 - 15.0 g/dL Final     DELTA CHECK NOTED     REPEATED TO VERIFY     HCT  Date Value Range Status  07/23/2012 27.1* 36.0 - 46.0 % Final    Physical Exam:  General: alert and cooperative Lochia: appropriate Uterine Fundus: firm Incision: healing well, staples intact DVT Evaluation: No evidence of DVT seen on physical exam. No cords or calf tenderness. No significant calf/ankle edema.  Discharge Diagnoses: Term Pregnancy-delivered  Discharge Information: Date: 07/24/2012 Activity: pelvic rest Diet: routine Medications: None, Ibuprofen and Percocet Condition: stable Instructions: refer to practice specific booklet Discharge to: home   Newborn Data: Live born female  Birth Weight: 7 lb 9 oz (3430 g) APGAR: 9, 9  Home with mother.  Kristin Church G 07/24/2012, 8:53 AM

## 2012-07-24 NOTE — Plan of Care (Signed)
Problem: Discharge Progression Outcomes Goal: Remove staples per MD order Outcome: Not Applicable Date Met:  07/24/12 Staples to be removed in MD office on Thursday 12/12

## 2012-07-30 ENCOUNTER — Telehealth (HOSPITAL_COMMUNITY): Payer: Self-pay | Admitting: *Deleted

## 2012-07-30 NOTE — Telephone Encounter (Signed)
Resolve episode 

## 2013-07-26 ENCOUNTER — Other Ambulatory Visit: Payer: Self-pay | Admitting: Gynecology

## 2013-07-26 DIAGNOSIS — N6310 Unspecified lump in the right breast, unspecified quadrant: Secondary | ICD-10-CM

## 2013-07-30 ENCOUNTER — Ambulatory Visit
Admission: RE | Admit: 2013-07-30 | Discharge: 2013-07-30 | Disposition: A | Discharge status: Home / Self Care | Payer: 59 | Source: Ambulatory Visit | Attending: Gynecology | Admitting: Gynecology

## 2013-07-30 DIAGNOSIS — N6310 Unspecified lump in the right breast, unspecified quadrant: Secondary | ICD-10-CM

## 2014-06-16 ENCOUNTER — Encounter (HOSPITAL_COMMUNITY): Payer: Self-pay | Admitting: Obstetrics and Gynecology

## 2015-03-13 ENCOUNTER — Other Ambulatory Visit: Payer: Self-pay | Admitting: Obstetrics and Gynecology

## 2015-03-16 LAB — CYTOLOGY - PAP

## 2016-03-04 NOTE — H&P (Addendum)
Kristin Church is a 40 y.o. female presenting for repeat c/s at 39 weeks.  Preg complicated by AMA with normal NIPT. History OB History    Gravida Para Term Preterm AB TAB SAB Ectopic Multiple Living   1 1 1       1      Past Medical History  Diagnosis Date  . Palpitations     Of questionable significance  . Pre-syncope     Probably multifactorial and long standing in duration   Past Surgical History  Procedure Laterality Date  . Cesarean section  07/22/2012    Procedure: CESAREAN SECTION;  Surgeon: Luz Lex, MD;  Location: [facility];  Service: Obstetrics;  Laterality: N/A;   Family History: family history includes Arthritis in her mother; Hypertension in her father and mother; Mitral valve prolapse in her mother. Social History:  reports that she has never smoked. She does not have any smokeless tobacco history on file. She reports that she drinks alcohol. Her drug history is not on file.   Prenatal Transfer Tool  Maternal Diabetes: No Genetic Screening: Normal Maternal Ultrasounds/Referrals: Normal Fetal Ultrasounds or other Referrals:  None Maternal Substance Abuse:  No Significant Maternal Medications:  None Significant Maternal Lab Results:  None Other Comments:  None  ROS    unknown if currently breastfeeding. Exam Physical Exam  Prenatal labs: ABO, Rh:   Antibody:   Rubella:   RPR:    HBsAg:    HIV:    GBS:     Assessment/Plan: IUP at 39 weeks Prev C.S for repeat and BTL Risks and benefits of C/S were discussed.  All questions were answered and informed consent was obtained.  Plan to proceed with low segment transverse Cesarean Section.  Multiparity and desires sterility.  Discussed risks and benefits of sterilization including but not limited to risk of tubal failure quoted as 12/998.  She gives her informed consent. This patient has been seen and examined.   All of her questions were answered.  Labs and vital signs reviewed.  Informed consent has been  obtained.  The History and Physical is current. Jia Dottavio C 03/04/2016, 11:47 AM

## 2016-03-17 ENCOUNTER — Telehealth (HOSPITAL_COMMUNITY): Payer: Self-pay | Admitting: *Deleted

## 2016-03-17 ENCOUNTER — Encounter (HOSPITAL_COMMUNITY): Payer: Self-pay | Admitting: *Deleted

## 2016-03-17 NOTE — Telephone Encounter (Signed)
Preadmission screen  

## 2016-03-18 ENCOUNTER — Inpatient Hospital Stay (HOSPITAL_COMMUNITY)
Admission: AD | Admit: 2016-03-18 | Discharge: 2016-03-18 | Disposition: A | Discharge status: Home Health | Payer: BLUE CROSS/BLUE SHIELD | Source: Ambulatory Visit | Attending: Obstetrics & Gynecology | Admitting: Obstetrics & Gynecology

## 2016-03-18 ENCOUNTER — Encounter (HOSPITAL_COMMUNITY): Payer: Self-pay | Admitting: *Deleted

## 2016-03-18 ENCOUNTER — Telehealth (HOSPITAL_COMMUNITY): Payer: Self-pay | Admitting: *Deleted

## 2016-03-18 DIAGNOSIS — Z3A00 Weeks of gestation of pregnancy not specified: Secondary | ICD-10-CM | POA: Diagnosis not present

## 2016-03-18 DIAGNOSIS — M549 Dorsalgia, unspecified: Secondary | ICD-10-CM | POA: Diagnosis present

## 2016-03-18 LAB — URINALYSIS, ROUTINE W REFLEX MICROSCOPIC
Bilirubin Urine: NEGATIVE
GLUCOSE, UA: NEGATIVE mg/dL
HGB URINE DIPSTICK: NEGATIVE
Ketones, ur: 15 mg/dL — AB
Leukocytes, UA: NEGATIVE
Nitrite: NEGATIVE
PH: 7 (ref 5.0–8.0)
Protein, ur: NEGATIVE mg/dL
SPECIFIC GRAVITY, URINE: 1.015 (ref 1.005–1.030)

## 2016-03-18 NOTE — Discharge Instructions (Signed)
Fetal Movement Counts  Patient Name: __________________________________________________ Patient Due Date: ____________________  Performing a fetal movement count is highly recommended in high-risk pregnancies, but it is good for every pregnant woman to do. Your health care provider may ask you to start counting fetal movements at 28 weeks of the pregnancy. Fetal movements often increase:  · After eating a full meal.  · After physical activity.  · After eating or drinking something sweet or cold.  · At rest.  Pay attention to when you feel the baby is most active. This will help you notice a pattern of your baby's sleep and wake cycles and what factors contribute to an increase in fetal movement. It is important to perform a fetal movement count at the same time each day when your baby is normally most active.   HOW TO COUNT FETAL MOVEMENTS  1. Find a quiet and comfortable area to sit or lie down on your left side. Lying on your left side provides the best blood and oxygen circulation to your baby.  2. Write down the day and time on a sheet of paper or in a journal.  3. Start counting kicks, flutters, swishes, rolls, or jabs in a 2-hour period. You should feel at least 10 movements within 2 hours.  4. If you do not feel 10 movements in 2 hours, wait 2-3 hours and count again. Look for a change in the pattern or not enough counts in 2 hours.  SEEK MEDICAL CARE IF:  · You feel less than 10 counts in 2 hours, tried twice.  · There is no movement in over an hour.  · The pattern is changing or taking longer each day to reach 10 counts in 2 hours.  · You feel the baby is not moving as he or she usually does.  Date: ____________ Movements: ____________ Start time: ____________ Finish time: ____________   Date: ____________ Movements: ____________ Start time: ____________ Finish time: ____________  Date: ____________ Movements: ____________ Start time: ____________ Finish time: ____________  Date: ____________ Movements:  ____________ Start time: ____________ Finish time: ____________  Date: ____________ Movements: ____________ Start time: ____________ Finish time: ____________  Date: ____________ Movements: ____________ Start time: ____________ Finish time: ____________  Date: ____________ Movements: ____________ Start time: ____________ Finish time: ____________  Date: ____________ Movements: ____________ Start time: ____________ Finish time: ____________   Date: ____________ Movements: ____________ Start time: ____________ Finish time: ____________  Date: ____________ Movements: ____________ Start time: ____________ Finish time: ____________  Date: ____________ Movements: ____________ Start time: ____________ Finish time: ____________  Date: ____________ Movements: ____________ Start time: ____________ Finish time: ____________  Date: ____________ Movements: ____________ Start time: ____________ Finish time: ____________  Date: ____________ Movements: ____________ Start time: ____________ Finish time: ____________  Date: ____________ Movements: ____________ Start time: ____________ Finish time: ____________   Date: ____________ Movements: ____________ Start time: ____________ Finish time: ____________  Date: ____________ Movements: ____________ Start time: ____________ Finish time: ____________  Date: ____________ Movements: ____________ Start time: ____________ Finish time: ____________  Date: ____________ Movements: ____________ Start time: ____________ Finish time: ____________  Date: ____________ Movements: ____________ Start time: ____________ Finish time: ____________  Date: ____________ Movements: ____________ Start time: ____________ Finish time: ____________  Date: ____________ Movements: ____________ Start time: ____________ Finish time: ____________   Date: ____________ Movements: ____________ Start time: ____________ Finish time: ____________  Date: ____________ Movements: ____________ Start time: ____________ Finish  time: ____________  Date: ____________ Movements: ____________ Start time: ____________ Finish time: ____________  Date: ____________ Movements: ____________ Start time:   ____________ Finish time: ____________  Date: ____________ Movements: ____________ Start time: ____________ Finish time: ____________  Date: ____________ Movements: ____________ Start time: ____________ Finish time: ____________  Date: ____________ Movements: ____________ Start time: ____________ Finish time: ____________   Date: ____________ Movements: ____________ Start time: ____________ Finish time: ____________  Date: ____________ Movements: ____________ Start time: ____________ Finish time: ____________  Date: ____________ Movements: ____________ Start time: ____________ Finish time: ____________  Date: ____________ Movements: ____________ Start time: ____________ Finish time: ____________  Date: ____________ Movements: ____________ Start time: ____________ Finish time: ____________  Date: ____________ Movements: ____________ Start time: ____________ Finish time: ____________  Date: ____________ Movements: ____________ Start time: ____________ Finish time: ____________   Date: ____________ Movements: ____________ Start time: ____________ Finish time: ____________  Date: ____________ Movements: ____________ Start time: ____________ Finish time: ____________  Date: ____________ Movements: ____________ Start time: ____________ Finish time: ____________  Date: ____________ Movements: ____________ Start time: ____________ Finish time: ____________  Date: ____________ Movements: ____________ Start time: ____________ Finish time: ____________  Date: ____________ Movements: ____________ Start time: ____________ Finish time: ____________  Date: ____________ Movements: ____________ Start time: ____________ Finish time: ____________   Date: ____________ Movements: ____________ Start time: ____________ Finish time: ____________  Date: ____________  Movements: ____________ Start time: ____________ Finish time: ____________  Date: ____________ Movements: ____________ Start time: ____________ Finish time: ____________  Date: ____________ Movements: ____________ Start time: ____________ Finish time: ____________  Date: ____________ Movements: ____________ Start time: ____________ Finish time: ____________  Date: ____________ Movements: ____________ Start time: ____________ Finish time: ____________  Date: ____________ Movements: ____________ Start time: ____________ Finish time: ____________   Date: ____________ Movements: ____________ Start time: ____________ Finish time: ____________  Date: ____________ Movements: ____________ Start time: ____________ Finish time: ____________  Date: ____________ Movements: ____________ Start time: ____________ Finish time: ____________  Date: ____________ Movements: ____________ Start time: ____________ Finish time: ____________  Date: ____________ Movements: ____________ Start time: ____________ Finish time: ____________  Date: ____________ Movements: ____________ Start time: ____________ Finish time: ____________     This information is not intended to replace advice given to you by your health care provider. Make sure you discuss any questions you have with your health care provider.     Document Released: 08/31/2006 Document Revised: 08/22/2014 Document Reviewed: 05/28/2012  [employer] Interactive Patient Education ©2016 [employer]

## 2016-03-18 NOTE — Telephone Encounter (Signed)
Preadmission screen Message on voicemail and with husband

## 2016-03-18 NOTE — MAU Note (Signed)
Pt presents to MAU with complaints of pelvic pressure with occasional contractions. Denies any vaginal bleeding or LOF

## 2016-03-21 ENCOUNTER — Telehealth (HOSPITAL_COMMUNITY): Payer: Self-pay | Admitting: *Deleted

## 2016-03-21 NOTE — Telephone Encounter (Signed)
Preadmission screen  

## 2016-03-22 ENCOUNTER — Encounter (HOSPITAL_COMMUNITY): Payer: Self-pay

## 2016-03-22 NOTE — Patient Instructions (Signed)
[place]  03/22/2016   Your procedure is scheduled on:  03/24/2016  Enter through the Main Entrance of [facility] up the phone at the desk and dial [phone].   Call this number if you have problems the morning of surgery: [phone]   Remember:   Do not eat food:After Midnight.  Do not drink clear liquids: After Midnight.  Take these medicines the morning of surgery with A SIP OF WATER: none   Do not wear jewelry, make-up or nail polish.  Do not wear lotions, powders, or perfumes. You may wear deodorant.  Do not shave 48 hours prior to surgery.  Do not bring valuables to the hospital.  [facility] is not   responsible for any belongings or valuables brought to the hospital.  Contacts, dentures or bridgework may not be worn into surgery.  Leave suitcase in the car. After surgery it may be brought to your room.  For patients admitted to the hospital, checkout time is 11:00 AM the day of              discharge.   Patients discharged the day of surgery will not be allowed to drive             home.  Name and phone number of your driver: na  Special Instructions:   N/A   Please read over the following fact sheets that you were given:   Surgical Site Infection Prevention

## 2016-03-23 ENCOUNTER — Encounter (HOSPITAL_COMMUNITY): Payer: Self-pay

## 2016-03-23 ENCOUNTER — Encounter (HOSPITAL_COMMUNITY)
Admission: RE | Admit: 2016-03-23 | Discharge: 2016-03-23 | Disposition: A | Discharge status: Home / Self Care | Payer: BLUE CROSS/BLUE SHIELD | Source: Ambulatory Visit | Attending: Obstetrics and Gynecology | Admitting: Obstetrics and Gynecology

## 2016-03-23 HISTORY — DX: Headache: R51

## 2016-03-23 HISTORY — DX: Supervision of elderly multigravida, unspecified trimester: O09.529

## 2016-03-23 HISTORY — DX: Personal history of other infectious and parasitic diseases: Z86.19

## 2016-03-23 HISTORY — DX: Headache, unspecified: R51.9

## 2016-03-23 LAB — CBC
HEMATOCRIT: 33.8 % — AB (ref 36.0–46.0)
Hemoglobin: 11.5 g/dL — ABNORMAL LOW (ref 12.0–15.0)
MCH: 29.3 pg (ref 26.0–34.0)
MCHC: 34 g/dL (ref 30.0–36.0)
MCV: 86.2 fL (ref 78.0–100.0)
PLATELETS: 180 10*3/uL (ref 150–400)
RBC: 3.92 MIL/uL (ref 3.87–5.11)
RDW: 14.7 % (ref 11.5–15.5)
WBC: 10.5 10*3/uL (ref 4.0–10.5)

## 2016-03-23 LAB — ABO/RH: ABO/RH(D): O POS

## 2016-03-23 LAB — TYPE AND SCREEN
ABO/RH(D): O POS
ANTIBODY SCREEN: NEGATIVE

## 2016-03-24 LAB — RPR: RPR Ser Ql: NONREACTIVE

## 2016-03-24 MED ORDER — DEXTROSE 5 % IV SOLN
2.0000 g | INTRAVENOUS | Status: AC
  Administered 2016-03-25: 2 g via INTRAVENOUS
  Filled 2016-03-24: qty 2

## 2016-03-25 ENCOUNTER — Inpatient Hospital Stay (HOSPITAL_COMMUNITY): Payer: BLUE CROSS/BLUE SHIELD | Admitting: Anesthesiology

## 2016-03-25 ENCOUNTER — Encounter (HOSPITAL_COMMUNITY): Payer: Self-pay | Admitting: *Deleted

## 2016-03-25 ENCOUNTER — Inpatient Hospital Stay (HOSPITAL_COMMUNITY)
Admission: RE | Admit: 2016-03-25 | Discharge: 2016-03-28 | DRG: 766 | Disposition: A | Discharge status: Home / Self Care | Payer: BLUE CROSS/BLUE SHIELD | Source: Ambulatory Visit | Attending: Obstetrics and Gynecology | Admitting: Obstetrics and Gynecology

## 2016-03-25 ENCOUNTER — Encounter (HOSPITAL_COMMUNITY)
Admission: RE | Disposition: A | Discharge status: Home / Self Care | Payer: Self-pay | Source: Ambulatory Visit | Attending: Obstetrics and Gynecology

## 2016-03-25 DIAGNOSIS — Z302 Encounter for sterilization: Secondary | ICD-10-CM | POA: Diagnosis not present

## 2016-03-25 DIAGNOSIS — E669 Obesity, unspecified: Secondary | ICD-10-CM | POA: Diagnosis present

## 2016-03-25 DIAGNOSIS — Z6833 Body mass index (BMI) 33.0-33.9, adult: Secondary | ICD-10-CM

## 2016-03-25 DIAGNOSIS — O34211 Maternal care for low transverse scar from previous cesarean delivery: Secondary | ICD-10-CM | POA: Diagnosis present

## 2016-03-25 DIAGNOSIS — Z3A39 39 weeks gestation of pregnancy: Secondary | ICD-10-CM | POA: Diagnosis not present

## 2016-03-25 DIAGNOSIS — O99214 Obesity complicating childbirth: Secondary | ICD-10-CM | POA: Diagnosis present

## 2016-03-25 DIAGNOSIS — O860 Infection of obstetric surgical wound: Secondary | ICD-10-CM | POA: Diagnosis not present

## 2016-03-25 SURGERY — Surgical Case
Anesthesia: Spinal | Laterality: Bilateral

## 2016-03-25 MED ORDER — PHENYLEPHRINE 8 MG IN D5W 100 ML (0.08MG/ML) PREMIX OPTIME
INJECTION | INTRAVENOUS | Status: DC | PRN
  Administered 2016-03-25: 60 ug/min via INTRAVENOUS

## 2016-03-25 MED ORDER — LACTATED RINGERS IV SOLN
Freq: Once | INTRAVENOUS | Status: AC
  Administered 2016-03-25: 1000 mL/h via INTRAVENOUS

## 2016-03-25 MED ORDER — FENTANYL CITRATE (PF) 100 MCG/2ML IJ SOLN: 25.0000 ug | INTRAMUSCULAR | Status: DC | PRN

## 2016-03-25 MED ORDER — NALOXONE HCL 2 MG/2ML IJ SOSY
1.0000 ug/kg/h | PREFILLED_SYRINGE | INTRAVENOUS | Status: DC | PRN
  Filled 2016-03-25: qty 2

## 2016-03-25 MED ORDER — TETANUS-DIPHTH-ACELL PERTUSSIS 5-2.5-18.5 LF-MCG/0.5 IM SUSP: 0.5000 mL | Freq: Once | INTRAMUSCULAR | Status: DC

## 2016-03-25 MED ORDER — KETOROLAC TROMETHAMINE 30 MG/ML IJ SOLN
INTRAMUSCULAR | Status: AC
Start: 2016-03-25 — End: 2016-03-25
  Filled 2016-03-25: qty 1

## 2016-03-25 MED ORDER — BUPIVACAINE IN DEXTROSE 0.75-8.25 % IT SOLN
INTRATHECAL | Status: DC | PRN
  Administered 2016-03-25: 1.6 mL via INTRATHECAL

## 2016-03-25 MED ORDER — NALBUPHINE HCL 10 MG/ML IJ SOLN: 5.0000 mg | INTRAMUSCULAR | Status: DC | PRN

## 2016-03-25 MED ORDER — MEPERIDINE HCL 25 MG/ML IJ SOLN: 6.2500 mg | INTRAMUSCULAR | Status: DC | PRN

## 2016-03-25 MED ORDER — OXYTOCIN 10 UNIT/ML IJ SOLN
INTRAVENOUS | Status: DC | PRN
  Administered 2016-03-25: 40 [IU] via INTRAVENOUS

## 2016-03-25 MED ORDER — SCOPOLAMINE 1 MG/3DAYS TD PT72
1.0000 | MEDICATED_PATCH | Freq: Once | TRANSDERMAL | Status: DC
  Administered 2016-03-25: 1.5 mg via TRANSDERMAL

## 2016-03-25 MED ORDER — OXYCODONE-ACETAMINOPHEN 5-325 MG PO TABS
2.0000 | ORAL_TABLET | ORAL | Status: DC | PRN
  Administered 2016-03-26 – 2016-03-28 (×7): 2 via ORAL
  Filled 2016-03-25 (×8): qty 2

## 2016-03-25 MED ORDER — OXYCODONE-ACETAMINOPHEN 5-325 MG PO TABS
1.0000 | ORAL_TABLET | ORAL | Status: DC | PRN
  Administered 2016-03-26 (×2): 1 via ORAL
  Filled 2016-03-25 (×2): qty 1

## 2016-03-25 MED ORDER — SIMETHICONE 80 MG PO CHEW: 80.0000 mg | CHEWABLE_TABLET | ORAL | Status: DC | PRN

## 2016-03-25 MED ORDER — NALBUPHINE HCL 10 MG/ML IJ SOLN: 5.0000 mg | Freq: Once | INTRAMUSCULAR | Status: DC | PRN

## 2016-03-25 MED ORDER — ONDANSETRON HCL 4 MG/2ML IJ SOLN: 4.0000 mg | Freq: Three times a day (TID) | INTRAMUSCULAR | Status: DC | PRN

## 2016-03-25 MED ORDER — ACETAMINOPHEN 325 MG PO TABS: 650.0000 mg | ORAL_TABLET | ORAL | Status: DC | PRN

## 2016-03-25 MED ORDER — LACTATED RINGERS IV SOLN: INTRAVENOUS | Status: DC

## 2016-03-25 MED ORDER — MORPHINE SULFATE-NACL 0.5-0.9 MG/ML-% IV SOSY
PREFILLED_SYRINGE | INTRAVENOUS | Status: AC
  Filled 2016-03-25: qty 1

## 2016-03-25 MED ORDER — MORPHINE SULFATE (PF) 0.5 MG/ML IJ SOLN
INTRAMUSCULAR | Status: DC | PRN
  Administered 2016-03-25: .2 mg via INTRATHECAL

## 2016-03-25 MED ORDER — SOD CITRATE-CITRIC ACID 500-334 MG/5ML PO SOLN
30.0000 mL | Freq: Once | ORAL | Status: AC
  Administered 2016-03-25: 30 mL via ORAL

## 2016-03-25 MED ORDER — ACETAMINOPHEN 500 MG PO TABS
1000.0000 mg | ORAL_TABLET | Freq: Four times a day (QID) | ORAL | Status: AC
  Administered 2016-03-25 – 2016-03-26 (×2): 1000 mg via ORAL
  Filled 2016-03-25 (×2): qty 2

## 2016-03-25 MED ORDER — KETOROLAC TROMETHAMINE 30 MG/ML IJ SOLN
30.0000 mg | Freq: Four times a day (QID) | INTRAMUSCULAR | Status: DC | PRN
  Administered 2016-03-25: 30 mg via INTRAMUSCULAR

## 2016-03-25 MED ORDER — LACTATED RINGERS IV SOLN
INTRAVENOUS | Status: DC
  Administered 2016-03-25 (×3): via INTRAVENOUS

## 2016-03-25 MED ORDER — SOD CITRATE-CITRIC ACID 500-334 MG/5ML PO SOLN
30.0000 mL | Freq: Once | ORAL | Status: DC
  Filled 2016-03-25: qty 15

## 2016-03-25 MED ORDER — PROMETHAZINE HCL 25 MG/ML IJ SOLN: 6.2500 mg | INTRAMUSCULAR | Status: DC | PRN

## 2016-03-25 MED ORDER — OXYTOCIN 10 UNIT/ML IJ SOLN
INTRAMUSCULAR | Status: AC
  Filled 2016-03-25: qty 4

## 2016-03-25 MED ORDER — KETOROLAC TROMETHAMINE 30 MG/ML IJ SOLN: 30.0000 mg | Freq: Four times a day (QID) | INTRAMUSCULAR | Status: DC | PRN

## 2016-03-25 MED ORDER — SIMETHICONE 80 MG PO CHEW
80.0000 mg | CHEWABLE_TABLET | Freq: Three times a day (TID) | ORAL | Status: DC
  Administered 2016-03-25 – 2016-03-28 (×7): 80 mg via ORAL
  Filled 2016-03-25 (×7): qty 1

## 2016-03-25 MED ORDER — SODIUM CHLORIDE 0.9% FLUSH: 3.0000 mL | INTRAVENOUS | Status: DC | PRN

## 2016-03-25 MED ORDER — FENTANYL CITRATE (PF) 100 MCG/2ML IJ SOLN
INTRAMUSCULAR | Status: AC
  Filled 2016-03-25: qty 2

## 2016-03-25 MED ORDER — SENNOSIDES-DOCUSATE SODIUM 8.6-50 MG PO TABS
2.0000 | ORAL_TABLET | ORAL | Status: DC
  Administered 2016-03-26 – 2016-03-27 (×3): 2 via ORAL
  Filled 2016-03-25 (×3): qty 2

## 2016-03-25 MED ORDER — OXYTOCIN 40 UNITS IN LACTATED RINGERS INFUSION - SIMPLE MED: 2.5000 [IU]/h | INTRAVENOUS | Status: AC

## 2016-03-25 MED ORDER — DIPHENHYDRAMINE HCL 50 MG/ML IJ SOLN: 12.5000 mg | INTRAMUSCULAR | Status: DC | PRN

## 2016-03-25 MED ORDER — SOD CITRATE-CITRIC ACID 500-334 MG/5ML PO SOLN
ORAL | Status: AC
  Administered 2016-03-25: 30 mL via ORAL
  Filled 2016-03-25: qty 15

## 2016-03-25 MED ORDER — PRENATAL MULTIVITAMIN CH
1.0000 | ORAL_TABLET | Freq: Every day | ORAL | Status: DC
  Administered 2016-03-26 – 2016-03-27 (×2): 1 via ORAL
  Filled 2016-03-25 (×2): qty 1

## 2016-03-25 MED ORDER — FENTANYL CITRATE (PF) 100 MCG/2ML IJ SOLN
INTRAMUSCULAR | Status: DC | PRN
  Administered 2016-03-25: 10 ug via INTRATHECAL

## 2016-03-25 MED ORDER — SCOPOLAMINE 1 MG/3DAYS TD PT72
MEDICATED_PATCH | TRANSDERMAL | Status: AC
  Administered 2016-03-25: 1.5 mg via TRANSDERMAL
  Filled 2016-03-25: qty 1

## 2016-03-25 MED ORDER — DIPHENHYDRAMINE HCL 25 MG PO CAPS: 25.0000 mg | ORAL_CAPSULE | ORAL | Status: DC | PRN

## 2016-03-25 MED ORDER — WITCH HAZEL-GLYCERIN EX PADS: 1.0000 "application " | MEDICATED_PAD | CUTANEOUS | Status: DC | PRN

## 2016-03-25 MED ORDER — SIMETHICONE 80 MG PO CHEW
80.0000 mg | CHEWABLE_TABLET | ORAL | Status: DC
  Administered 2016-03-26 – 2016-03-27 (×3): 80 mg via ORAL
  Filled 2016-03-25 (×3): qty 1

## 2016-03-25 MED ORDER — IBUPROFEN 600 MG PO TABS
600.0000 mg | ORAL_TABLET | Freq: Four times a day (QID) | ORAL | Status: DC
  Administered 2016-03-25 – 2016-03-28 (×11): 600 mg via ORAL
  Filled 2016-03-25 (×11): qty 1

## 2016-03-25 MED ORDER — ZOLPIDEM TARTRATE 5 MG PO TABS: 5.0000 mg | ORAL_TABLET | Freq: Every evening | ORAL | Status: DC | PRN

## 2016-03-25 MED ORDER — DIPHENHYDRAMINE HCL 25 MG PO CAPS
25.0000 mg | ORAL_CAPSULE | Freq: Four times a day (QID) | ORAL | Status: DC | PRN
Start: 2016-03-25 — End: 2016-03-28

## 2016-03-25 MED ORDER — ONDANSETRON HCL 4 MG/2ML IJ SOLN
INTRAMUSCULAR | Status: AC
  Filled 2016-03-25: qty 2

## 2016-03-25 MED ORDER — PHENYLEPHRINE 8 MG IN D5W 100 ML (0.08MG/ML) PREMIX OPTIME
INJECTION | INTRAVENOUS | Status: AC
  Filled 2016-03-25: qty 100

## 2016-03-25 MED ORDER — NALOXONE HCL 0.4 MG/ML IJ SOLN: 0.4000 mg | INTRAMUSCULAR | Status: DC | PRN

## 2016-03-25 MED ORDER — COCONUT OIL OIL: 1.0000 "application " | TOPICAL_OIL | Status: DC | PRN

## 2016-03-25 MED ORDER — MENTHOL 3 MG MT LOZG: 1.0000 | LOZENGE | OROMUCOSAL | Status: DC | PRN

## 2016-03-25 MED ORDER — DIBUCAINE 1 % RE OINT: 1.0000 "application " | TOPICAL_OINTMENT | RECTAL | Status: DC | PRN

## 2016-03-25 SURGICAL SUPPLY — 29 items
CLAMP CORD UMBIL (MISCELLANEOUS) IMPLANT
CLIP FILSHIE TUBAL LIGA STRL (Clip) ×6 IMPLANT
CLOTH BEACON ORANGE TIMEOUT ST (SAFETY) ×3 IMPLANT
DRSG OPSITE POSTOP 4X10 (GAUZE/BANDAGES/DRESSINGS) ×3 IMPLANT
DURAPREP 26ML APPLICATOR (WOUND CARE) ×3 IMPLANT
ELECT REM PT RETURN 9FT ADLT (ELECTROSURGICAL) ×3
ELECTRODE REM PT RTRN 9FT ADLT (ELECTROSURGICAL) ×1 IMPLANT
EXTRACTOR VACUUM M CUP 4 TUBE (SUCTIONS) IMPLANT
EXTRACTOR VACUUM M CUP 4' TUBE (SUCTIONS)
GLOVE BIOGEL PI IND STRL 7.0 (GLOVE) ×1 IMPLANT
GLOVE BIOGEL PI INDICATOR 7.0 (GLOVE) ×2
GLOVE SURG ORTHO 8.0 STRL STRW (GLOVE) ×3 IMPLANT
GOWN STRL REUS W/TWL LRG LVL3 (GOWN DISPOSABLE) ×6 IMPLANT
KIT ABG SYR 3ML LUER SLIP (SYRINGE) ×3 IMPLANT
LIQUID BAND (GAUZE/BANDAGES/DRESSINGS) ×3 IMPLANT
NEEDLE HYPO 25X5/8 SAFETYGLIDE (NEEDLE) ×3 IMPLANT
NS IRRIG 1000ML POUR BTL (IV SOLUTION) ×3 IMPLANT
PACK C SECTION WH (CUSTOM PROCEDURE TRAY) ×3 IMPLANT
PAD OB MATERNITY 4.3X12.25 (PERSONAL CARE ITEMS) ×3 IMPLANT
PENCIL SMOKE EVAC W/HOLSTER (ELECTROSURGICAL) ×3 IMPLANT
SUT MNCRL 0 VIOLET CTX 36 (SUTURE) ×3 IMPLANT
SUT MON AB 4-0 PS1 27 (SUTURE) ×3 IMPLANT
SUT MONOCRYL 0 CTX 36 (SUTURE) ×6
SUT PDS AB 1 CT  36 (SUTURE)
SUT PDS AB 1 CT 36 (SUTURE) IMPLANT
SUT VIC AB 1 CTX 36 (SUTURE)
SUT VIC AB 1 CTX36XBRD ANBCTRL (SUTURE) IMPLANT
TOWEL OR 17X24 6PK STRL BLUE (TOWEL DISPOSABLE) ×3 IMPLANT
TRAY FOLEY CATH SILVER 14FR (SET/KITS/TRAYS/PACK) ×3 IMPLANT

## 2016-03-25 NOTE — Anesthesia Postprocedure Evaluation (Signed)
Anesthesia Post Note  Patient: Kristin Church  Procedure(s) Performed: Procedure(s) (LRB): REPEAT CESAREAN SECTION WITH BILATERAL TUBAL LIGATION (Bilateral)  Patient location during evaluation: Mother Baby Anesthesia Type: Spinal Level of consciousness: awake, awake and alert, oriented and patient cooperative Pain management: pain level controlled Vital Signs Assessment: post-procedure vital signs reviewed and stable Respiratory status: spontaneous breathing, nonlabored ventilation and respiratory function stable Cardiovascular status: stable Postop Assessment: patient able to bend at knees, no headache, no backache and no signs of nausea or vomiting Anesthetic complications: no     Last Vitals:  Vitals:   03/25/16 1130 03/25/16 1230  BP: 102/68 105/69  Pulse: 61 (!) 57  Resp: 18 18  Temp: 36.3 C 36.5 C    Last Pain:  Vitals:   03/25/16 1230  TempSrc: Oral  PainSc:    Pain Goal: Patients Stated Pain Goal: 5 (03/25/16 0642)               Habiba Treloar L

## 2016-03-25 NOTE — Lactation Note (Signed)
This note was copied from a baby's chart. Lactation Consultation Note  Patient Name: Kristin Church [id number] Date: 03/25/2016 Reason for consult: Initial assessment  Met with Mom and FOB, baby 63 hrs old.  This is 2nd baby to BF, had difficulty with first baby due to some inverted nipples.  This baby has been latching well, without any difficulty or discomfort.  Latch score given was 9.  Mom states she has fed baby 4 times already.  Encouraged skin to skin, and feeding baby on cue.  Brochure given with information about IP and OP lactation services available to her.  Encouraged Mom to call for assistance as needed.  Lactation to follow up in am.            Consult Status Consult Status: Follow-up Date: 03/26/16 Follow-up type: In-patient    Broadus John 03/25/2016, 5:15 PM

## 2016-03-25 NOTE — Transfer of Care (Signed)
Immediate Anesthesia Transfer of Care Note  Patient: Kristin Church  Procedure(s) Performed: Procedure(s) with comments: REPEAT CESAREAN SECTION WITH BILATERAL TUBAL LIGATION (Bilateral) - REPEAT EDC 03/28/16 ALLERGIC TO STERI STRIPS Herlinda K to RNFA  Patient Location: PACU  Anesthesia Type:Spinal  Level of Consciousness: awake  Airway & Oxygen Therapy: Patient Spontanous Breathing  Post-op Assessment: Report given to RN and Post -op Vital signs reviewed and stable  Post vital signs: Reviewed and stable  Last Vitals:  Vitals:   03/25/16 0642  BP: 120/75  Pulse: 81  Resp: 16  Temp: 36.4 C    Last Pain:  Vitals:   03/25/16 0642  TempSrc: Oral      Patients Stated Pain Goal: 5 ([id number])  Complications: No apparent anesthesia complications

## 2016-03-25 NOTE — Anesthesia Procedure Notes (Signed)
Spinal  Patient location during procedure: OR Start time: 03/25/2016 7:42 AM End time: 03/25/2016 7:44 AM Staffing Anesthesiologist: Catalina Gravel Performed: anesthesiologist  Preanesthetic Checklist Completed: patient identified, surgical consent, pre-op evaluation, timeout performed, IV checked, risks and benefits discussed and monitors and equipment checked Spinal Block Patient position: sitting Prep: site prepped and draped and DuraPrep Patient monitoring: continuous pulse ox and blood pressure Approach: midline Location: L3-4 Injection technique: single-shot Needle Needle type: Pencan  Needle gauge: 24 G Needle length: 9 cm Assessment Sensory level: T4 Additional Notes Functioning IV was confirmed and monitors were applied. Sterile prep and drape, including hand hygiene, mask and sterile gloves were used. The patient was positioned and the spine was prepped. The skin was anesthetized with lidocaine.  Free flow of clear CSF was obtained prior to injecting local anesthetic into the CSF.  The spinal needle aspirated freely following injection.  The needle was carefully withdrawn.  The patient tolerated the procedure well. Consent was obtained prior to procedure with all questions answered and concerns addressed. Risks including but not limited to bleeding, infection, nerve damage, paralysis, failed block, inadequate analgesia, allergic reaction, high spinal, itching and headache were discussed and the patient wished to proceed.   Hoy Morn, MD

## 2016-03-25 NOTE — Consult Note (Signed)
Neonatology Note:   Attendance at C-section:    I was asked by Dr. Corinna Capra to attend this repeat C/S at term. The mother is a G2P1, GBS not done with good prenatal care. ROM 0 hours before delivery, fluid clear. Infant vigorous with good spontaneous cry and tone. Needed only minimal bulb suctioning. Ap 8/9. Lungs clear to ausc in DR. To CN to care of Pediatrician.  Monia Sabal Katherina Mires, MD

## 2016-03-25 NOTE — Op Note (Signed)
Cesarean Section Procedure Note  Pre-operative Diagnosis: IUP at 39weeks, prev C/S for repeat, desires sterilization  Post-operative Diagnosis: same  Surgeon: Luz Lex   Assistants: Royston Sinner  Anesthesia: spinal  Procedure:  Low Segment Transverse cesarean section and BTL with filshie clips  Procedure Details  The patient was seen in the Holding Room. The risks, benefits, complications, treatment options, and expected outcomes were discussed with the patient.  The patient concurred with the proposed plan, giving informed consent.  The site of surgery properly noted/marked.. A Time Out was held and the above information confirmed.  After induction of anesthesia, the patient was draped and prepped in the usual sterile manner. A Pfannenstiel incision was made and carried down through the subcutaneous tissue to the fascia. Fascial incision was made and extended transversely. The fascia was separated from the underlying rectus tissue superiorly and inferiorly. The peritoneum was identified and entered. Peritoneal incision was extended longitudinally. The utero-vesical peritoneal reflection was incised transversely and the bladder flap was bluntly freed from the lower uterine segment. A low transverse uterine incision was made. Delivered from vertex presentation was a baby with Apgar scores of 9 at one minute and 9 at five minutes. After the umbilical cord was clamped and cut cord blood was obtained for evaluation. The placenta was removed intact and appeared normal. The uterine outline, tubes and ovaries appeared normal. The uterine incision was closed with running locked sutures of 0 monocryl and imbricated with 0 monocryl. Hemostasis was observed.The fallopian tubes were identified by their fimbriated ends and filshie clips were placed across the tubes at the midportion of each tube.  Good placement was noted bilaterally.  Lavage was carried out until clear. The peritoneum was then closed with 0  monocryl and rectus muscles plicated in the midline.  After hemostasis was assured, the fascia was then reapproximated with running sutures of 0 Vicryl. Irrigation was applied and after adequate hemostasis was assured, the skin was reapproximated with subcutaneous sutures using 4-0 monocryl.  Instrument, sponge, and needle counts were correct prior the abdominal closure and at the conclusion of the case. The patient received 2 grams cefotetan preoperatively.  Findings: Viable female  Estimated Blood Loss:  500cc         Specimens: Placenta was sent to labor and delivery         Complications:  None

## 2016-03-25 NOTE — Anesthesia Preprocedure Evaluation (Signed)
Anesthesia Evaluation  Patient identified by MRN, date of birth, ID band Patient awake    Reviewed: Allergy & Precautions, NPO status , Patient's Chart, lab work & pertinent test results  Airway Mallampati: II  TM Distance: >3 FB Neck ROM: Full    Dental  (+) Teeth Intact, Dental Advisory Given   Pulmonary neg pulmonary ROS,    Pulmonary exam normal breath sounds clear to auscultation       Cardiovascular Exercise Tolerance: Good negative cardio ROS Normal cardiovascular exam Rhythm:Regular Rate:Normal  Palpitations--of questionable significance Pre-syncope--probably multifactorial and long standing in duration    Neuro/Psych  Headaches, negative psych ROS   GI/Hepatic negative GI ROS, Neg liver ROS,   Endo/Other  Obesity   Renal/GU negative Renal ROS     Musculoskeletal negative musculoskeletal ROS (+)   Abdominal   Peds  Hematology  (+) Blood dyscrasia, anemia , Plt 180k   Anesthesia Other Findings Day of surgery medications reviewed with the patient.  Reproductive/Obstetrics (+) Pregnancy C-section x1                             Anesthesia Physical Anesthesia Plan  ASA: II  Anesthesia Plan: Spinal   Post-op Pain Management:    Induction:   Airway Management Planned:   Additional Equipment:   Intra-op Plan:   Post-operative Plan:   Informed Consent: I have reviewed the patients History and Physical, chart, labs and discussed the procedure including the risks, benefits and alternatives for the proposed anesthesia with the patient or authorized representative who has indicated his/her understanding and acceptance.   Dental advisory given  Plan Discussed with: CRNA, Anesthesiologist and Surgeon  Anesthesia Plan Comments: (Discussed risks and benefits of and differences between spinal and general. Discussed risks of spinal including headache, backache, failure, bleeding,  infection, and nerve damage. Patient consents to spinal. Questions answered. Coagulation studies and platelet count acceptable.)        Anesthesia Quick Evaluation

## 2016-03-26 LAB — CBC
HEMATOCRIT: 29.1 % — AB (ref 36.0–46.0)
Hemoglobin: 9.9 g/dL — ABNORMAL LOW (ref 12.0–15.0)
MCH: 29.4 pg (ref 26.0–34.0)
MCHC: 34 g/dL (ref 30.0–36.0)
MCV: 86.4 fL (ref 78.0–100.0)
PLATELETS: 137 10*3/uL — AB (ref 150–400)
RBC: 3.37 MIL/uL — AB (ref 3.87–5.11)
RDW: 14.7 % (ref 11.5–15.5)
WBC: 11.4 10*3/uL — ABNORMAL HIGH (ref 4.0–10.5)

## 2016-03-26 LAB — BIRTH TISSUE RECOVERY COLLECTION (PLACENTA DONATION)

## 2016-03-26 NOTE — Progress Notes (Signed)
Subjective: Postpartum Day 1: Cesarean Delivery Patient reports tolerating PO and + flatus.    Objective: Vital signs in last 24 hours: Temp:  [97.4 F (36.3 C)-98.7 F (37.1 C)] 97.6 F (36.4 C) (08/12 0405) Pulse Rate:  [57-76] 58 (08/12 0405) Resp:  [14-24] 24 (08/12 0405) BP: (95-111)/(46-77) 95/46 (08/12 0405) SpO2:  [96 %-100 %] 96 % (08/12 0405) Weight:  [182 lb (82.6 kg)] 182 lb (82.6 kg) (08/11 1550)  Physical Exam:  General: alert, cooperative, appears stated age and no distress Lochia: appropriate Uterine Fundus: firm Incision: healing well DVT Evaluation: No evidence of DVT seen on physical exam.   Recent Labs  03/23/16 1110 03/26/16 0548  HGB 11.5* 9.9*  HCT 33.8* 29.1*    Assessment/Plan: Status post Cesarean section. Doing well postoperatively.  Continue current care.  Donshay Lupinski C 03/26/2016, 7:24 AM

## 2016-03-26 NOTE — Lactation Note (Addendum)
This note was copied from a baby's chart. Lactation Consultation Note  Mom called out for assist secondary to sore nipples, fussy baby, and she had just pumped with no result. "Kristin Church" was awake & sucking on a pacifier. Per parent report, infant has been fussy & difficult to latch for the entire day.   Mom allowed me to do hand expression to show her that she did have colostrum, which was spoon-fed to infant w/ease. Mom wanted to try & latch, but Kristin Church would not latch.  Parents were amenable to supplementing. Mom preferred to do formula instead of additional hand expression. Kristin Church cup-fed w/ease. Dad was able to return demonstration.   Mom has a Hygeia DEBP. Mom's nipples pink and tender (w/slight scabbing on L nipple). Comfort Gels provided w/instructions for use.    Matthias Hughs [facility] 03/26/2016, 11:17 PM

## 2016-03-27 NOTE — Lactation Note (Addendum)
This note was copied from a baby's chart. Lactation Consultation Note  Baby sleeping in mother's arms.  Mother states breastfeeding has improved. She is latching only on R side due to crack/bleeding on L side. Recommend mother pump on L side to stimulate breast with either manual pump or her personal DEBP. Left IBCLC phone number and suggest she call to view next feeding. Suggest getting coconut oil from RN for soreness and also apply ebm.  Mother called to view latch. Baby latched on R side.  Intermittent sucks and swallows observed. Reminded mother to pump on L side until soreness subsides. Mother has been wearing comfort gels for soreness.  Patient Name: Kristin Church [id number] Date: 03/27/2016 Reason for consult: Follow-up assessment   Maternal Data    Feeding Feeding Type: Breast Fed Length of feed: 20 min  LATCH Score/Interventions                      Lactation Tools Discussed/Used     Consult Status Consult Status: Follow-up Date: 03/27/16 Follow-up type: In-patient    [name] [facility] 03/27/2016, 11:42 AM

## 2016-03-27 NOTE — Progress Notes (Signed)
Subjective: Postpartum Day 2: Cesarean Delivery Patient reports incisional pain, tolerating PO and + flatus.    Objective: Vital signs in last 24 hours: Temp:  [98.1 F (36.7 C)-98.2 F (36.8 C)] 98.1 F (36.7 C) (08/13 0622) Pulse Rate:  [65-73] 65 (08/13 0622) Resp:  [20] 20 (08/13 0622) BP: (110-112)/(58-71) 112/71 (08/13 0622) SpO2:  [98 %] 98 % (08/12 1823)  Physical Exam:  General: alert, cooperative, appears stated age and mild distress Lochia: appropriate Uterine Fundus: firm Incision: healing well DVT Evaluation: No evidence of DVT seen on physical exam.   Recent Labs  03/26/16 0548  HGB 9.9*  HCT 29.1*    Assessment/Plan: Status post Cesarean section. Doing well postoperatively.  Continue current care.  Yarel Rushlow C 03/27/2016, 8:59 AM

## 2016-03-28 MED ORDER — AMOXICILLIN-POT CLAVULANATE 875-125 MG PO TABS: 1.0000 | ORAL_TABLET | Freq: Two times a day (BID) | ORAL | 0 refills | Status: DC

## 2016-03-28 MED ORDER — IBUPROFEN 600 MG PO TABS: 600.0000 mg | ORAL_TABLET | Freq: Four times a day (QID) | ORAL | 1 refills | Status: DC

## 2016-03-28 MED ORDER — AMOXICILLIN-POT CLAVULANATE 875-125 MG PO TABS
1.0000 | ORAL_TABLET | Freq: Two times a day (BID) | ORAL | Status: DC
  Administered 2016-03-28: 1 via ORAL
  Filled 2016-03-28: qty 1

## 2016-03-28 MED ORDER — OXYCODONE-ACETAMINOPHEN 5-325 MG PO TABS: 1.0000 | ORAL_TABLET | ORAL | 0 refills | Status: DC | PRN

## 2016-03-28 NOTE — Discharge Summary (Signed)
Obstetric Discharge Summary Reason for Admission: cesarean section Prenatal Procedures: ultrasound Intrapartum Procedures: cesarean: low cervical, transverse Postpartum Procedures: none Complications-Operative and Postpartum: Cellulitis (mild) treated w/augmentin Hemoglobin  Date Value Ref Range Status  03/26/2016 9.9 (L) 12.0 - 15.0 g/dL Final   HCT  Date Value Ref Range Status  03/26/2016 29.1 (L) 36.0 - 46.0 % Final    Physical Exam:  General: alert and cooperative Lochia: appropriate Uterine Fundus: firm Incision: healing well, erythema noted superior to incision DVT Evaluation: No evidence of DVT seen on physical exam. Negative Homan's sign. No cords or calf tenderness. No significant calf/ankle edema.  Discharge Diagnoses: Term Pregnancy-delivered  Discharge Information: Date: 03/28/2016 Activity: pelvic rest Diet: routine Medications: PNV, Ibuprofen, Percocet and Augmentin Condition: stable Instructions: refer to practice specific booklet Discharge to: home   Newborn Data: Live born female  Birth Weight: 7 lb 6 oz (3345 g) APGAR: 8, 9  Home with mother. Augmentin for mild cellulitis. RTC 4 days for incision check.   CURTIS,CAROL G 03/28/2016, 8:50 AM

## 2016-03-28 NOTE — Progress Notes (Signed)
Patient's incision and abd up to umbilicus is red, warm and tender to touch. Pt afebrile. Dr Royston Sinner and Juanda Chance, [facility] aware. Staring pt on oral antibiotics. Kristin Church

## 2016-03-28 NOTE — Lactation Note (Signed)
This note was copied from a baby's chart. Lactation Consultation Note  Mom reports BF is going well and that sore nipples are improving.  Discussed breast compression with feedings to help drain the breast.  Encouraged mom to express milk if breasts are not softened with the feedings also discussed engorgement treatment and prevention.  Reminded of support groups and outpatient services. Patient Name: Kristin Church [id number] Date: 03/28/2016 Reason for consult: Follow-up assessment   Maternal Data    Feeding Feeding Type: Breast Fed Length of feed: 25 min  LATCH Score/Interventions                      Lactation Tools Discussed/Used     Consult Status      Van Clines 03/28/2016, 9:05 AM

## 2016-03-28 NOTE — Progress Notes (Signed)
Subjective: Postpartum Day 3: Cesarean Delivery Patient reports incisional pain, tolerating PO, + flatus and no problems voiding.    Objective: Vital signs in last 24 hours: Temp:  [97.6 F (36.4 C)-98.4 F (36.9 C)] 98.4 F (36.9 C) (08/14 0600) Pulse Rate:  [65-67] 67 (08/14 0600) Resp:  [16-18] 18 (08/14 0600) BP: (111-126)/(59-63) 111/59 (08/14 0600) SpO2:  [100 %] 100 % (08/13 1705)  Physical Exam:  General: alert and cooperative Lochia: appropriate Uterine Fundus: firm Incision: healing well, erythema noted superior to incision  DVT Evaluation: No evidence of DVT seen on physical exam. Negative Homan's sign. No cords or calf tenderness. No significant calf/ankle edema.   Recent Labs  03/26/16 0548  HGB 9.9*  HCT 29.1*    Assessment/Plan: Status post Cesarean section. Postoperative course complicated by incisional cellulitis  Discharge home with standard precautions and return to clinic in 4-5 days for incision check  Will start Augmentin.  CURTIS,CAROL G 03/28/2016, 8:45 AM  POD#3 s/p CS for repeat w/BTL. No complaints except mild itching/erythema around incsion AFVSS. Exam benign except for mild cellulitis. Hgb appropriate, no s/s of anemia on exam.  Mild cellulitis: home w/Augmentin. No evidence of wound infection. Precautions given. Anticipate d/c today. RTC in 4 days for incision re-check.  Lucillie Garfinkel MD

## 2016-04-04 NOTE — Addendum Note (Signed)
Addendum  created 04/04/16 0910 by Catalina Gravel, MD   Anesthesia Staff edited

## 2016-12-13 HISTORY — PX: HERNIA REPAIR: SHX51

## 2017-05-14 ENCOUNTER — Inpatient Hospital Stay (HOSPITAL_COMMUNITY)
Admission: AD | Admit: 2017-05-14 | Discharge: 2017-05-14 | Disposition: A | Discharge status: Home / Self Care | Payer: BLUE CROSS/BLUE SHIELD | Source: Ambulatory Visit | Attending: Obstetrics and Gynecology | Admitting: Obstetrics and Gynecology

## 2017-05-14 ENCOUNTER — Encounter (HOSPITAL_COMMUNITY): Payer: Self-pay

## 2017-05-14 DIAGNOSIS — N719 Inflammatory disease of uterus, unspecified: Secondary | ICD-10-CM | POA: Insufficient documentation

## 2017-05-14 DIAGNOSIS — R509 Fever, unspecified: Secondary | ICD-10-CM | POA: Diagnosis present

## 2017-05-14 DIAGNOSIS — Z9889 Other specified postprocedural states: Secondary | ICD-10-CM | POA: Diagnosis present

## 2017-05-14 DIAGNOSIS — N9989 Other postprocedural complications and disorders of genitourinary system: Secondary | ICD-10-CM | POA: Insufficient documentation

## 2017-05-14 LAB — CBC WITH DIFFERENTIAL/PLATELET
BASOS ABS: 0 10*3/uL (ref 0.0–0.1)
Basophils Relative: 0 %
Eosinophils Absolute: 0.1 10*3/uL (ref 0.0–0.7)
Eosinophils Relative: 1 %
HEMATOCRIT: 36.9 % (ref 36.0–46.0)
HEMOGLOBIN: 12.4 g/dL (ref 12.0–15.0)
LYMPHS PCT: 17 %
Lymphs Abs: 1.8 10*3/uL (ref 0.7–4.0)
MCH: 30.2 pg (ref 26.0–34.0)
MCHC: 33.6 g/dL (ref 30.0–36.0)
MCV: 90 fL (ref 78.0–100.0)
Monocytes Absolute: 0.5 10*3/uL (ref 0.1–1.0)
Monocytes Relative: 4 %
NEUTROS ABS: 8.2 10*3/uL — AB (ref 1.7–7.7)
NEUTROS PCT: 78 %
Platelets: 186 10*3/uL (ref 150–400)
RBC: 4.1 MIL/uL (ref 3.87–5.11)
RDW: 12.9 % (ref 11.5–15.5)
WBC: 10.6 10*3/uL — AB (ref 4.0–10.5)

## 2017-05-14 LAB — URINALYSIS, ROUTINE W REFLEX MICROSCOPIC
Bacteria, UA: NONE SEEN
Bilirubin Urine: NEGATIVE
Glucose, UA: NEGATIVE mg/dL
KETONES UR: NEGATIVE mg/dL
Leukocytes, UA: NEGATIVE
Nitrite: NEGATIVE
PROTEIN: NEGATIVE mg/dL
Specific Gravity, Urine: 1.006 (ref 1.005–1.030)
pH: 6 (ref 5.0–8.0)

## 2017-05-14 MED ORDER — AMOXICILLIN-POT CLAVULANATE 875-125 MG PO TABS: 1.0000 | ORAL_TABLET | Freq: Two times a day (BID) | ORAL | 0 refills | Status: DC

## 2017-05-14 NOTE — MAU Note (Signed)
Patient had abdominal reconstructive surgery in May has had upper abdominal pain, feels swollen in lower abdomen.

## 2017-05-14 NOTE — MAU Note (Signed)
Thursday afternoon had D&c and ablation, started having severe cramping and fever of 101.

## 2017-05-14 NOTE — MAU Provider Note (Signed)
Chief Complaint: Fever and Abdominal Pain   First Provider Initiated Contact with Patient 05/14/17 1439      SUBJECTIVE HPI: Kristin Church is a 41 y.o. [id number] who is 3 days postop following D&C and ablation who presents to maternity admissions reporting intermittent fever and abdominal pain since day 1 postop. She reports her pain initially was in her upper abdomen and she called and spoke with her doctor who thought it might be musculoskeletal from tension.  She also had an abdominal reconstructive surgery earlier in 2018 which may have caused more soreness in her upper abdomen.  Now, she reports the upper abdominal pain has resolved but she has more pelvic/lower abdominal pain than she expected 3 days postop. It is reduced by ibuprofen and tylenol but not resolved. She reports she had a fever of 101 on her first day after surgery and it has intermittently returned at 99.6 to 100.7 several times since the first time. It is associated with chills and the pelvic pain described above.  There are no other associated symptoms. She has tried Tylenol, ibuprofen, rest, heat, and the symptoms persist.  Her vaginal bleeding is scant, spotting only since the surgery.  She reports normal bowel movements and denies urinary symptoms.   HPI  Past Medical History:  Diagnosis Date  . AMA (advanced maternal age) multigravida 47+   . Headache   . Hx of varicella   . Palpitations    Of questionable significance  . Pre-syncope    Probably multifactorial and long standing in duration   Past Surgical History:  Procedure Laterality Date  . CESAREAN SECTION  07/22/2012   Procedure: CESAREAN SECTION;  Surgeon: Luz Lex, MD;  Location: [facility];  Service: Obstetrics;  Laterality: N/A;  . CESAREAN SECTION WITH BILATERAL TUBAL LIGATION Bilateral 03/25/2016   Procedure: REPEAT CESAREAN SECTION WITH BILATERAL TUBAL LIGATION;  Surgeon: Louretta Shorten, MD;  Location: [facility];  Service: Obstetrics;  Laterality:  Bilateral;  REPEAT EDC 03/28/16 ALLERGIC TO STERI STRIPS Leighana K to RNFA  . HERNIA REPAIR  12/2016  . WISDOM TOOTH EXTRACTION     Social History   Social History  . Marital status: Married    Spouse name: N/A  . Number of children: N/A  . Years of education: N/A   Occupational History  . Not on file.   Social History Main Topics  . Smoking status: Never Smoker  . Smokeless tobacco: Never Used  . Alcohol use Yes  . Drug use: No  . Sexual activity: Not Currently    Birth control/ protection: None   Other Topics Concern  . Not on file   Social History Narrative  . No narrative on file   No current facility-administered medications on file prior to encounter.    Current Outpatient Prescriptions on File Prior to Encounter  Medication Sig Dispense Refill  . Omega-3 Fatty Acids (FISH OIL PO) Take 1 capsule by mouth every morning.    . Prenatal Vit-Fe Fumarate-FA (PRENATAL MULTIVITAMIN) TABS Take 1 tablet by mouth daily.     Allergies  Allergen Reactions  . Adhesive [Tape]     Pt states allerigic to steri strips, causes blister and redness.    ROS:  Review of Systems  Constitutional: Positive for chills and fever. Negative for fatigue.  Respiratory: Negative for shortness of breath.   Cardiovascular: Negative for chest pain.  Gastrointestinal: Positive for abdominal pain.  Genitourinary: Positive for pelvic pain and vaginal bleeding. Negative for difficulty urinating,  dysuria, flank pain, vaginal discharge and vaginal pain.  Neurological: Negative for dizziness and headaches.  Psychiatric/Behavioral: Negative.      I have reviewed patient's Past Medical Hx, Surgical Hx, Family Hx, Social Hx, medications and allergies.   Physical Exam  Patient Vitals for the past 24 hrs:  BP Temp Temp src Pulse Resp Height Weight  05/14/17 1452 - 99 F (37.2 C) Axillary - - - -  05/14/17 1451 - 98.8 F (37.1 C) Oral - - - -  05/14/17 1317 - - - - - 5\' 2"  (1.575 m) 126 lb  (57.2 kg)  05/14/17 1314 128/75 98.1 F (36.7 C) Oral 80 16 - -   Constitutional: Well-developed, well-nourished female in no acute distress.  Cardiovascular: normal rate Respiratory: normal effort GI: Abd soft, mild tenderness in lower abdomen/pelvis, no upper abdominal tendneress. No rebound tenderness or guarding. Pos BS x 4 MS: Extremities nontender, no edema, normal ROM Neurologic: Alert and oriented x 4.  GU: Neg CVAT.  PELVIC EXAM: Deferred   LAB RESULTS Results for orders placed or performed during the hospital encounter of 05/14/17 (from the past 24 hour(s))  Urinalysis, Routine w reflex microscopic     Status: Abnormal   Collection Time: 05/14/17  1:15 PM  Result Value Ref Range   Color, Urine STRAW (A) YELLOW   APPearance CLEAR CLEAR   Specific Gravity, Urine 1.006 1.005 - 1.030   pH 6.0 5.0 - 8.0   Glucose, UA NEGATIVE NEGATIVE mg/dL   Hgb urine dipstick SMALL (A) NEGATIVE   Bilirubin Urine NEGATIVE NEGATIVE   Ketones, ur NEGATIVE NEGATIVE mg/dL   Protein, ur NEGATIVE NEGATIVE mg/dL   Nitrite NEGATIVE NEGATIVE   Leukocytes, UA NEGATIVE NEGATIVE   RBC / HPF 0-5 0 - 5 RBC/hpf   WBC, UA 0-5 0 - 5 WBC/hpf   Bacteria, UA NONE SEEN NONE SEEN   Squamous Epithelial / LPF 0-5 (A) NONE SEEN   Mucus PRESENT   CBC with Differential/Platelet     Status: Abnormal   Collection Time: 05/14/17  2:26 PM  Result Value Ref Range   WBC 10.6 (H) 4.0 - 10.5 K/uL   RBC 4.10 3.87 - 5.11 MIL/uL   Hemoglobin 12.4 12.0 - 15.0 g/dL   HCT 36.9 36.0 - 46.0 %   MCV 90.0 78.0 - 100.0 fL   MCH 30.2 26.0 - 34.0 pg   MCHC 33.6 30.0 - 36.0 g/dL   RDW 12.9 11.5 - 15.5 %   Platelets 186 150 - 400 K/uL   Neutrophils Relative % 78 %   Neutro Abs 8.2 (H) 1.7 - 7.7 K/uL   Lymphocytes Relative 17 %   Lymphs Abs 1.8 0.7 - 4.0 K/uL   Monocytes Relative 4 %   Monocytes Absolute 0.5 0.1 - 1.0 K/uL   Eosinophils Relative 1 %   Eosinophils Absolute 0.1 0.0 - 0.7 K/uL   Basophils Relative 0 %    Basophils Absolute 0.0 0.0 - 0.1 K/uL       IMAGING No results found.  MAU Management/MDM: Ordered labs and reviewed results.  Consult Dr Matthew Saras with assessment and findings.  Treat for mild endometritis with Augmentin 500 mg BID x 7 days, pt to call Dr Corinna Capra tomorrow to set up follow up this week. Return to MAU if symptoms persist or worsen.  Pt stable at time of discharge.  ASSESSMENT 1. Endometritis   2. Other postoperative complication involving genitourinary system     PLAN Discharge home Allergies as  of 05/14/2017      Reactions   Adhesive [tape]    Pt states allerigic to steri strips, causes blister and redness.      Medication List    TAKE these medications   acetaminophen 325 MG tablet Commonly known as:  TYLENOL Take 650 mg by mouth every 6 (six) hours as needed for moderate pain or fever.   amoxicillin-clavulanate 875-125 MG tablet Commonly known as:  AUGMENTIN Take 1 tablet by mouth 2 (two) times daily.   FISH OIL PO Take 1 capsule by mouth every morning.   ibuprofen 200 MG tablet Commonly known as:  ADVIL,MOTRIN Take 400 mg by mouth every 6 (six) hours as needed for moderate pain.   prenatal multivitamin Tabs tablet Take 1 tablet by mouth daily.   sertraline 100 MG tablet Commonly known as:  ZOLOFT Take 100 mg by mouth daily.            Discharge Care Instructions        Start     Ordered   05/14/17 0000  Discharge patient    Question Answer Comment  Discharge disposition 01-Home or Self Care   Discharge patient date 05/14/2017      05/14/17 1458   05/14/17 0000  amoxicillin-clavulanate (AUGMENTIN) 875-125 MG tablet  2 times daily    Question:  Supervising Provider  Answer:  Donnamae Jude   05/14/17 1458     Follow-up Information    Louretta Shorten, MD Follow up.   Specialty:  Obstetrics and Gynecology Why:  Call for appointment this week. Return to MAU with worsening symptoms. Contact information: [address], [address] [place] [address] [phone]           Fatima Blank Certified Nurse-Midwife 05/14/2017  3:00 PM

## 2017-05-16 ENCOUNTER — Inpatient Hospital Stay (HOSPITAL_COMMUNITY)
Admission: AD | Admit: 2017-05-16 | Discharge: 2017-05-18 | DRG: 863 | Disposition: A | Discharge status: Home / Self Care | Payer: BLUE CROSS/BLUE SHIELD | Source: Ambulatory Visit | Attending: Obstetrics and Gynecology | Admitting: Obstetrics and Gynecology

## 2017-05-16 ENCOUNTER — Encounter (HOSPITAL_COMMUNITY): Payer: Self-pay

## 2017-05-16 ENCOUNTER — Inpatient Hospital Stay (HOSPITAL_COMMUNITY): Payer: BLUE CROSS/BLUE SHIELD

## 2017-05-16 DIAGNOSIS — T8140XA Infection following a procedure, unspecified, initial encounter: Secondary | ICD-10-CM | POA: Diagnosis present

## 2017-05-16 DIAGNOSIS — N719 Inflammatory disease of uterus, unspecified: Secondary | ICD-10-CM | POA: Diagnosis present

## 2017-05-16 DIAGNOSIS — Z23 Encounter for immunization: Secondary | ICD-10-CM

## 2017-05-16 DIAGNOSIS — G8918 Other acute postprocedural pain: Secondary | ICD-10-CM

## 2017-05-16 LAB — CBC
HEMATOCRIT: 36.3 % (ref 36.0–46.0)
HEMOGLOBIN: 12 g/dL (ref 12.0–15.0)
MCH: 29.7 pg (ref 26.0–34.0)
MCHC: 33.1 g/dL (ref 30.0–36.0)
MCV: 89.9 fL (ref 78.0–100.0)
Platelets: 213 10*3/uL (ref 150–400)
RBC: 4.04 MIL/uL (ref 3.87–5.11)
RDW: 12.6 % (ref 11.5–15.5)
WBC: 9.8 10*3/uL (ref 4.0–10.5)

## 2017-05-16 LAB — CBC WITH DIFFERENTIAL/PLATELET
BASOS ABS: 0.1 10*3/uL (ref 0.0–0.1)
Basophils Relative: 1 %
Eosinophils Absolute: 0 10*3/uL (ref 0.0–0.7)
Eosinophils Relative: 0 %
HEMATOCRIT: 36.4 % (ref 36.0–46.0)
HEMOGLOBIN: 12.5 g/dL (ref 12.0–15.0)
LYMPHS PCT: 17 %
Lymphs Abs: 1.7 10*3/uL (ref 0.7–4.0)
MCH: 30.5 pg (ref 26.0–34.0)
MCHC: 34.3 g/dL (ref 30.0–36.0)
MCV: 88.8 fL (ref 78.0–100.0)
Monocytes Absolute: 0.7 10*3/uL (ref 0.1–1.0)
Monocytes Relative: 7 %
NEUTROS ABS: 7.7 10*3/uL (ref 1.7–7.7)
Neutrophils Relative %: 75 %
PLATELETS: 213 10*3/uL (ref 150–400)
RBC: 4.1 MIL/uL (ref 3.87–5.11)
RDW: 12.7 % (ref 11.5–15.5)
WBC: 10.2 10*3/uL (ref 4.0–10.5)

## 2017-05-16 LAB — COMPREHENSIVE METABOLIC PANEL
ALK PHOS: 50 U/L (ref 38–126)
ALT: 14 U/L (ref 14–54)
AST: 15 U/L (ref 15–41)
Albumin: 4 g/dL (ref 3.5–5.0)
Anion gap: 9 (ref 5–15)
BILIRUBIN TOTAL: 0.9 mg/dL (ref 0.3–1.2)
BUN: 7 mg/dL (ref 6–20)
CHLORIDE: 102 mmol/L (ref 101–111)
CO2: 23 mmol/L (ref 22–32)
CREATININE: 0.67 mg/dL (ref 0.44–1.00)
Calcium: 8.5 mg/dL — ABNORMAL LOW (ref 8.9–10.3)
GFR calc Af Amer: >60 mL/min (ref >60)
Glucose, Bld: 87 mg/dL (ref 65–99)
Potassium: 3.8 mmol/L (ref 3.5–5.1)
Sodium: 134 mmol/L — ABNORMAL LOW (ref 135–145)
Total Protein: 7.3 g/dL (ref 6.5–8.1)

## 2017-05-16 LAB — URINALYSIS, ROUTINE W REFLEX MICROSCOPIC
Bilirubin Urine: NEGATIVE
Glucose, UA: NEGATIVE mg/dL
Hgb urine dipstick: NEGATIVE
Ketones, ur: NEGATIVE mg/dL
LEUKOCYTES UA: NEGATIVE
Nitrite: NEGATIVE
PROTEIN: NEGATIVE mg/dL
Specific Gravity, Urine: 1.005 (ref 1.005–1.030)
pH: 5 (ref 5.0–8.0)

## 2017-05-16 MED ORDER — ZOLPIDEM TARTRATE 5 MG PO TABS: 5.0000 mg | ORAL_TABLET | Freq: Every evening | ORAL | Status: DC | PRN

## 2017-05-16 MED ORDER — HYDROMORPHONE HCL 1 MG/ML IJ SOLN
1.0000 mg | Freq: Once | INTRAMUSCULAR | Status: AC
  Administered 2017-05-16: 1 mg via INTRAMUSCULAR
  Filled 2017-05-16: qty 1

## 2017-05-16 MED ORDER — PRENATAL MULTIVITAMIN CH
1.0000 | ORAL_TABLET | Freq: Every day | ORAL | Status: DC
  Administered 2017-05-17: 1 via ORAL
  Filled 2017-05-16: qty 1

## 2017-05-16 MED ORDER — INFLUENZA VAC SPLIT QUAD 0.5 ML IM SUSY
0.5000 mL | PREFILLED_SYRINGE | INTRAMUSCULAR | Status: AC
  Administered 2017-05-18: 0.5 mL via INTRAMUSCULAR
  Filled 2017-05-16: qty 0.5

## 2017-05-16 MED ORDER — HYDROMORPHONE HCL 1 MG/ML IJ SOLN: 0.2000 mg | INTRAMUSCULAR | Status: DC | PRN

## 2017-05-16 MED ORDER — DEXTROSE 5 % IV SOLN
2.0000 g | Freq: Two times a day (BID) | INTRAVENOUS | Status: DC
  Administered 2017-05-16 – 2017-05-18 (×4): 2 g via INTRAVENOUS
  Filled 2017-05-16 (×4): qty 2

## 2017-05-16 MED ORDER — OXYCODONE-ACETAMINOPHEN 5-325 MG PO TABS
1.0000 | ORAL_TABLET | ORAL | Status: DC | PRN
  Administered 2017-05-16 – 2017-05-18 (×4): 2 via ORAL
  Filled 2017-05-16 (×4): qty 2

## 2017-05-16 MED ORDER — IBUPROFEN 600 MG PO TABS: 600.0000 mg | ORAL_TABLET | Freq: Four times a day (QID) | ORAL | Status: DC | PRN

## 2017-05-16 MED ORDER — ONDANSETRON HCL 4 MG/2ML IJ SOLN
4.0000 mg | Freq: Four times a day (QID) | INTRAMUSCULAR | Status: DC | PRN
  Administered 2017-05-17: 4 mg via INTRAVENOUS
  Filled 2017-05-16: qty 2

## 2017-05-16 MED ORDER — LACTATED RINGERS IV SOLN
INTRAVENOUS | Status: DC
  Administered 2017-05-16 – 2017-05-18 (×4): via INTRAVENOUS

## 2017-05-16 MED ORDER — ONDANSETRON HCL 4 MG PO TABS: 4.0000 mg | ORAL_TABLET | Freq: Four times a day (QID) | ORAL | Status: DC | PRN

## 2017-05-16 MED ORDER — KETOROLAC TROMETHAMINE 30 MG/ML IJ SOLN
30.0000 mg | Freq: Four times a day (QID) | INTRAMUSCULAR | Status: DC | PRN
  Administered 2017-05-16 – 2017-05-17 (×3): 30 mg via INTRAVENOUS
  Filled 2017-05-16 (×3): qty 1

## 2017-05-16 NOTE — MAU Provider Note (Signed)
History     CSN: [id number]  Arrival date and time: 05/16/17 1210   First Provider Initiated Contact with Patient 05/16/17 1311      Chief Complaint  Patient presents with  . Abdominal Pain  . Back Pain  . Fever   HPI  Ms. Kristin Church is a 41 y.o. [id number] female s/p ablation and D&C on 05/11/17 who presents to MAU today with complaint of fever and abdominal and back pain. The patient has had fever from 99-101 F since Friday. She is taking Ibuprofen, last dose at 11 am today. She rates her pain at 8/10. She has also been on Augmentin since Sunday when she was last seen in MAU. She denies UTI symptoms.    OB History    Gravida Para Term Preterm AB Living   2 2 2     2    SAB TAB Ectopic Multiple Live Births         0 2      Past Medical History:  Diagnosis Date  . AMA (advanced maternal age) multigravida 47+   . Headache   . Hx of varicella   . Palpitations    Of questionable significance  . Pre-syncope    Probably multifactorial and long standing in duration    Past Surgical History:  Procedure Laterality Date  . ABLATION    . CESAREAN SECTION  07/22/2012   Procedure: CESAREAN SECTION;  Surgeon: Luz Lex, MD;  Location: [facility];  Service: Obstetrics;  Laterality: N/A;  . CESAREAN SECTION WITH BILATERAL TUBAL LIGATION Bilateral 03/25/2016   Procedure: REPEAT CESAREAN SECTION WITH BILATERAL TUBAL LIGATION;  Surgeon: Louretta Shorten, MD;  Location: [facility];  Service: Obstetrics;  Laterality: Bilateral;  REPEAT EDC 03/28/16 ALLERGIC TO STERI STRIPS Waneda K to RNFA  . DILATION AND CURETTAGE OF UTERUS    . HERNIA REPAIR  12/2016  . WISDOM TOOTH EXTRACTION      Family History  Problem Relation Age of Onset  . Hypertension Mother   . Mitral valve prolapse Mother   . Arthritis Mother   . Rheum arthritis Mother   . Hypertension Father   . Cancer Maternal Grandmother        breast  . Cancer Maternal Grandfather        colon  . Heart disease Paternal  Grandfather   . Diabetes Paternal Grandfather     Social History  Substance Use Topics  . Smoking status: Never Smoker  . Smokeless tobacco: Never Used  . Alcohol use Yes     Comment: occasional    Allergies:  Allergies  Allergen Reactions  . Adhesive [Tape]     Pt states allerigic to steri strips, causes blister and redness.    Prescriptions Prior to Admission  Medication Sig Dispense Refill Last Dose  . amoxicillin-clavulanate (AUGMENTIN) 875-125 MG tablet Take 1 tablet by mouth 2 (two) times daily. 14 tablet 0 05/16/2017 at Unknown time  . ibuprofen (ADVIL,MOTRIN) 200 MG tablet Take 600 mg by mouth every 6 (six) hours as needed for moderate pain.    05/16/2017 at Unknown time  . Omega-3 Fatty Acids (FISH OIL PO) Take 1 capsule by mouth every morning.   05/15/2017 at Unknown time  . Prenatal Vit-Fe Fumarate-FA (PRENATAL MULTIVITAMIN) TABS Take 1 tablet by mouth daily.   05/15/2017 at Unknown time  . sertraline (ZOLOFT) 100 MG tablet Take 100 mg by mouth daily.   05/16/2017 at Unknown time  . acetaminophen (TYLENOL) 325 MG  tablet Take 650 mg by mouth every 6 (six) hours as needed for moderate pain or fever.   prn    Review of Systems  Constitutional: Positive for fever.  Gastrointestinal: Positive for abdominal pain. Negative for constipation, diarrhea, nausea and vomiting.  Genitourinary: Negative for dysuria, frequency, urgency and vaginal bleeding.   Physical Exam   Blood pressure 119/82, pulse 88, temperature 98.7 F (37.1 C), resp. rate 18, height 5\' 2"  (1.575 m), weight 125 lb (56.7 kg), SpO2 100 %, unknown if currently breastfeeding.  Physical Exam  Nursing note and vitals reviewed. Constitutional: She is oriented to person, place, and time. She appears well-developed and well-nourished. No distress.  HENT:  Head: Normocephalic and atraumatic.  Cardiovascular: Normal rate.   Respiratory: Effort normal.  GI: Soft. She exhibits no distension and no mass. There is  tenderness (moderate tenderness to palpation of the lower abdomen bilaterally). There is no rebound and no guarding.  Neurological: She is alert and oriented to person, place, and time.  Skin: Skin is warm and dry. No erythema.  Psychiatric: She has a normal mood and affect.   Results for orders placed or performed during the hospital encounter of 05/16/17 (from the past 24 hour(s))  Urinalysis, Routine w reflex microscopic     Status: Abnormal   Collection Time: 05/16/17 12:35 PM  Result Value Ref Range   Color, Urine STRAW (A) YELLOW   APPearance CLEAR CLEAR   Specific Gravity, Urine 1.005 1.005 - 1.030   pH 5.0 5.0 - 8.0   Glucose, UA NEGATIVE NEGATIVE mg/dL   Hgb urine dipstick NEGATIVE NEGATIVE   Bilirubin Urine NEGATIVE NEGATIVE   Ketones, ur NEGATIVE NEGATIVE mg/dL   Protein, ur NEGATIVE NEGATIVE mg/dL   Nitrite NEGATIVE NEGATIVE   Leukocytes, UA NEGATIVE NEGATIVE  CBC with Differential/Platelet     Status: None   Collection Time: 05/16/17  1:20 PM  Result Value Ref Range   WBC 10.2 4.0 - 10.5 K/uL   RBC 4.10 3.87 - 5.11 MIL/uL   Hemoglobin 12.5 12.0 - 15.0 g/dL   HCT 36.4 36.0 - 46.0 %   MCV 88.8 78.0 - 100.0 fL   MCH 30.5 26.0 - 34.0 pg   MCHC 34.3 30.0 - 36.0 g/dL   RDW 12.7 11.5 - 15.5 %   Platelets 213 150 - 400 K/uL   Neutrophils Relative % 75 %   Neutro Abs 7.7 1.7 - 7.7 K/uL   Lymphocytes Relative 17 %   Lymphs Abs 1.7 0.7 - 4.0 K/uL   Monocytes Relative 7 %   Monocytes Absolute 0.7 0.1 - 1.0 K/uL   Eosinophils Relative 0 %   Eosinophils Absolute 0.0 0.0 - 0.7 K/uL   Basophils Relative 1 %   Basophils Absolute 0.1 0.0 - 0.1 K/uL  Comprehensive metabolic panel     Status: Abnormal   Collection Time: 05/16/17  1:20 PM  Result Value Ref Range   Sodium 134 (L) 135 - 145 mmol/L   Potassium 3.8 3.5 - 5.1 mmol/L   Chloride 102 101 - 111 mmol/L   CO2 23 22 - 32 mmol/L   Glucose, Bld 87 65 - 99 mg/dL   BUN 7 6 - 20 mg/dL   Creatinine, Ser 0.67 0.44 - 1.00  mg/dL   Calcium 8.5 (L) 8.9 - 10.3 mg/dL   Total Protein 7.3 6.5 - 8.1 g/dL   Albumin 4.0 3.5 - 5.0 g/dL   AST 15 15 - 41 U/L   ALT 14 14 -  54 U/L   Alkaline Phosphatase 50 38 - 126 U/L   Total Bilirubin 0.9 0.3 - 1.2 mg/dL   GFR calc non Af Amer >60 >60 mL/min   GFR calc Af Amer >60 >60 mL/min   Anion gap 9 5 - 15   No results found.   MAU Course  Procedures None  MDM UA, CBC, CMP today  1 mg Dilaudid given for pain  Patient reports improvement in pain, now rated at 4/10 Discussed patient with Dr. Corinna Capra. Admit for IV pain management and IV antibiotics   Assessment and Plan  A: POD #6 s/p ablation and D&C  Post operative infection/pain   P: Admit for IV pain management and IV antibiotics  Kerry Hough, PA-C 05/16/2017, 3:53 PM

## 2017-05-16 NOTE — H&P (Signed)
Kristin Church is a 41 y.o. G37P2002 female s/p ablation and D&C on 05/11/17 who presents to MAU today with complaint of fever and abdominal and back pain. The patient has had fever from 99-101 F since Friday. She is taking Ibuprofen, last dose at 11 am today. She rates her pain at 8/10. She has also been on Augmentin since Sunday when she was last seen in MAU. She denies UTI symptoms.            OB History    Gravida Para Term Preterm AB Living   2 2 2     2    SAB TAB Ectopic Multiple Live Births         0 2          Past Medical History:  Diagnosis Date  . AMA (advanced maternal age) multigravida 45+   . Headache   . Hx of varicella   . Palpitations    Of questionable significance  . Pre-syncope    Probably multifactorial and long standing in duration         Past Surgical History:  Procedure Laterality Date  . ABLATION    . CESAREAN SECTION  07/22/2012   Procedure: CESAREAN SECTION;  Surgeon: Luz Lex, MD;  Location: [facility];  Service: Obstetrics;  Laterality: N/A;  . CESAREAN SECTION WITH BILATERAL TUBAL LIGATION Bilateral 03/25/2016   Procedure: REPEAT CESAREAN SECTION WITH BILATERAL TUBAL LIGATION;  Surgeon: Louretta Shorten, MD;  Location: [facility];  Service: Obstetrics;  Laterality: Bilateral;  REPEAT EDC 03/28/16 ALLERGIC TO STERI STRIPS Bedie K to RNFA  . DILATION AND CURETTAGE OF UTERUS    . HERNIA REPAIR  12/2016  . WISDOM TOOTH EXTRACTION           Family History  Problem Relation Age of Onset  . Hypertension Mother   . Mitral valve prolapse Mother   . Arthritis Mother   . Rheum arthritis Mother   . Hypertension Father   . Cancer Maternal Grandmother        breast  . Cancer Maternal Grandfather        colon  . Heart disease Paternal Grandfather   . Diabetes Paternal Grandfather            Social History   Substance Use Topics   . Smoking status: Never Smoker   . Smokeless tobacco:  Never Used   . Alcohol use Yes     Comment: occasional     Allergies:       Allergies  Allergen Reactions  . Adhesive [Tape]     Pt states allerigic to steri strips, causes blister and redness.    Prescriptions Prior to Admission  Medication Sig Dispense Refill Last Dose  . amoxicillin-clavulanate (AUGMENTIN) 875-125 MG tablet Take 1 tablet by mouth 2 (two) times daily. 14 tablet 0 05/16/2017 at Unknown time  . ibuprofen (ADVIL,MOTRIN) 200 MG tablet Take 600 mg by mouth every 6 (six) hours as needed for moderate pain.    05/16/2017 at Unknown time  . Omega-3 Fatty Acids (FISH OIL PO) Take 1 capsule by mouth every morning.   05/15/2017 at Unknown time  . Prenatal Vit-Fe Fumarate-FA (PRENATAL MULTIVITAMIN) TABS Take 1 tablet by mouth daily.   05/15/2017 at Unknown time  . sertraline (ZOLOFT) 100 MG tablet Take 100 mg by mouth daily.   05/16/2017 at Unknown time  . acetaminophen (TYLENOL) 325 MG tablet Take 650 mg by mouth  every 6 (six) hours as needed for moderate pain or fever.   prn    Review of Systems  Constitutional: Positive for fever.  Gastrointestinal: Positive for abdominal pain. Negative for constipation, diarrhea, nausea and vomiting.  Genitourinary: Negative for dysuria, frequency, urgency and vaginal bleeding.   Physical Exam   Blood pressure 119/82, pulse 88, temperature 98.7 F (37.1 C), resp. rate 18, height 5\' 2"  (1.575 m), weight 125 lb (56.7 kg), SpO2 100 %, unknown if currently breastfeeding.  Physical Exam  Nursing note and vitals reviewed. Constitutional: She is oriented to person, place, and time. She appears well-developed and well-nourished. No distress.  HENT:  Head: Normocephalic and atraumatic.  Cardiovascular: Normal rate.   Respiratory: Effort normal.  GI: Soft. She exhibits no distension and no mass. There is tenderness (moderate tenderness to palpation of the lower abdomen bilaterally). There is no rebound and no guarding.   Neurological: She is alert and oriented to person, place, and time.  Skin: Skin is warm and dry. No erythema.  Psychiatric: She has a normal mood and affect.   Lab Results Last 24 Hours       Results for orders placed or performed during the hospital encounter of 05/16/17 (from the past 24 hour(s))  Urinalysis, Routine w reflex microscopic     Status: Abnormal   Collection Time: 05/16/17 12:35 PM  Result Value Ref Range   Color, Urine STRAW (A) YELLOW   APPearance CLEAR CLEAR   Specific Gravity, Urine 1.005 1.005 - 1.030   pH 5.0 5.0 - 8.0   Glucose, UA NEGATIVE NEGATIVE mg/dL   Hgb urine dipstick NEGATIVE NEGATIVE   Bilirubin Urine NEGATIVE NEGATIVE   Ketones, ur NEGATIVE NEGATIVE mg/dL   Protein, ur NEGATIVE NEGATIVE mg/dL   Nitrite NEGATIVE NEGATIVE   Leukocytes, UA NEGATIVE NEGATIVE  CBC with Differential/Platelet     Status: None   Collection Time: 05/16/17  1:20 PM  Result Value Ref Range   WBC 10.2 4.0 - 10.5 K/uL   RBC 4.10 3.87 - 5.11 MIL/uL   Hemoglobin 12.5 12.0 - 15.0 g/dL   HCT 36.4 36.0 - 46.0 %   MCV 88.8 78.0 - 100.0 fL   MCH 30.5 26.0 - 34.0 pg   MCHC 34.3 30.0 - 36.0 g/dL   RDW 12.7 11.5 - 15.5 %   Platelets 213 150 - 400 K/uL   Neutrophils Relative % 75 %   Neutro Abs 7.7 1.7 - 7.7 K/uL   Lymphocytes Relative 17 %   Lymphs Abs 1.7 0.7 - 4.0 K/uL   Monocytes Relative 7 %   Monocytes Absolute 0.7 0.1 - 1.0 K/uL   Eosinophils Relative 0 %   Eosinophils Absolute 0.0 0.0 - 0.7 K/uL   Basophils Relative 1 %   Basophils Absolute 0.1 0.0 - 0.1 K/uL  Comprehensive metabolic panel     Status: Abnormal   Collection Time: 05/16/17  1:20 PM  Result Value Ref Range   Sodium 134 (L) 135 - 145 mmol/L   Potassium 3.8 3.5 - 5.1 mmol/L   Chloride 102 101 - 111 mmol/L   CO2 23 22 - 32 mmol/L   Glucose, Bld 87 65 - 99 mg/dL   BUN 7 6 - 20 mg/dL   Creatinine, Ser 0.67 0.44 - 1.00 mg/dL   Calcium 8.5 (L) 8.9 - 10.3 mg/dL    Total Protein 7.3 6.5 - 8.1 g/dL   Albumin 4.0 3.5 - 5.0 g/dL   AST 15 15 - 41 U/L  ALT 14 14 - 54 U/L   Alkaline Phosphatase 50 38 - 126 U/L   Total Bilirubin 0.9 0.3 - 1.2 mg/dL   GFR calc non Af Amer >60 >60 mL/min   GFR calc Af Amer >60 >60 mL/min   Anion gap 9 5 - 15     No results found.   MAU Course  Procedures None  MDM UA, CBC, CMP today  1 mg Dilaudid given for pain  Patient reports improvement in pain, now rated at 4/10 Discussed patient with Dr. Corinna Capra. Admit for IV pain management and IV antibiotics   Assessment and Plan  A: POD #6 s/p ablation and D&C  Post operative infection/pain   P: Admit for IV pain management and IV antibiotics  Kerry Hough, PA-C 05/16/2017, 3:53 PM   I reviewed the [place], labs and examined Ms. Soderholm and discussed the plan of care.  Post op Pain pos infection Fluid collection intramural lower segment Plan IV abx, NSAIDs, pain management and observation tonight DL

## 2017-05-16 NOTE — MAU Note (Signed)
Pt is s/p ablation and D&C on 09/27, seen in office this am and sent here, pt reports lower back, lower abd pain x 3 days, fever.

## 2017-05-16 NOTE — Plan of Care (Signed)
Problem: Pain Managment: Goal: General experience of comfort will improve Outcome: Progressing Pt encouraged to take Toradol or Ibuprofen for pain and inflammation. Percocet also ordered as needed for severe pain.  Problem: Physical Regulation: Goal: Ability to maintain clinical measurements within normal limits will improve Outcome: Progressing WBC elevated. IV antibiotics ordered to fight infection.

## 2017-05-17 NOTE — Progress Notes (Signed)
Patient ID: SHEMECA LUKASIK, female   DOB: 25-May-1976, 41 y.o.   MRN: [id number] Pt began having worsening pain around noon.  She denies any bleeding or discharge.  I discussed cervical stenosis as a possible cause of the hemato or pylometria.  She consented for cervical dilation.  Sterile speculum was placed.  Betadine prep of cx.  Tenaculum placed and easily passed milex dilator with return of small to moderate amount of bloody fluid.  After removal of the tenaculum, good hemostasis was achieved.  Will continue to monitor sxs DL

## 2017-05-17 NOTE — Progress Notes (Signed)
Subjective: Patient reports tolerating PO and no problems voiding.   Pain has improved.  Some pain with voiding but clear  Objective: I have reviewed patient's vital signs, intake and output and radiology results.  General: alert, cooperative, appears stated age and mild distress GI: soft, non-tender; bowel sounds normal; no masses,  no organomegaly Vaginal Bleeding: none   Assessment/Plan: Post op pain Endometritis - cont IV abx x 24 h.  Consider d/c home if doing well this pm   LOS: 1 day    Kristin Church C 05/17/2017, 10:14 AM

## 2017-05-17 NOTE — Progress Notes (Signed)
Subjective: Patient reports not feeling well.  Some cramping, minimal bleeding Not much appetite but + flatus Objective: I have reviewed patient's vital signs.  GI: soft, non-tender; bowel sounds normal; no masses,  no organomegaly   Assessment/Plan: Endometritis -cont IV abx.  NSAIDs If not improving, consider CT Repeat CBC in am Afebrile today    LOS: 1 day    Arlisa Leclere C 05/17/2017, 5:54 PM

## 2017-05-18 LAB — CBC
HEMATOCRIT: 32.3 % — AB (ref 36.0–46.0)
Hemoglobin: 11 g/dL — ABNORMAL LOW (ref 12.0–15.0)
MCH: 30.5 pg (ref 26.0–34.0)
MCHC: 34.1 g/dL (ref 30.0–36.0)
MCV: 89.5 fL (ref 78.0–100.0)
Platelets: 194 10*3/uL (ref 150–400)
RBC: 3.61 MIL/uL — ABNORMAL LOW (ref 3.87–5.11)
RDW: 12.5 % (ref 11.5–15.5)
WBC: 9.2 10*3/uL (ref 4.0–10.5)

## 2017-05-18 MED ORDER — FLUCONAZOLE 150 MG PO TABS: 150.0000 mg | ORAL_TABLET | Freq: Every day | ORAL | 3 refills | Status: DC

## 2017-05-18 MED ORDER — OXYCODONE-ACETAMINOPHEN 5-325 MG PO TABS: 1.0000 | ORAL_TABLET | ORAL | 0 refills | Status: DC | PRN

## 2017-05-18 MED ORDER — IBUPROFEN 600 MG PO TABS: 600.0000 mg | ORAL_TABLET | Freq: Four times a day (QID) | ORAL | 0 refills | Status: AC | PRN

## 2017-05-18 NOTE — Progress Notes (Signed)
Discharge teaching complete with pt. Pt understood all information and did not have any questions. Pt discharged home to family. 

## 2017-05-18 NOTE — Discharge Summary (Signed)
Physician Discharge Summary  Patient ID: Kristin Church MRN: [id number] DOB/AGE: 1976/05/21 41 y.o.  Admit date: 05/16/2017 Discharge date: 05/18/2017  Admission Diagnoses:endometritis,pelvic pain  Discharge Diagnoses: same Active Problems:   Post op infection   Discharged Condition: good  Hospital Course: Pt had IV abx, pain meds, NSAIDs, and dilation of cervix to relieve pyomentria, hematometria as a result of recent endometrial ablation.  Normal CBC and minimal pain at discharge  Consults: None  Significant Diagnostic Studies: ultrasound  Treatments: antibiotics: cefotetan and dilation of cervix  Discharge Exam: Blood pressure 115/63, pulse 78, temperature 98.4 F (36.9 C), temperature source Oral, resp. rate 18, height 5\' 2"  (1.575 m), weight 125 lb (56.7 kg), SpO2 100 %, unknown if currently breastfeeding. General appearance: alert, cooperative, appears stated age and no distress GI: soft, non-tender; bowel sounds normal; no masses,  no organomegaly  Disposition: 01-Home or Self Care  Discharge Instructions    Call MD for:  difficulty breathing, headache or visual disturbances    Complete by:  As directed    Call MD for:  persistant nausea and vomiting    Complete by:  As directed    Call MD for:  redness, tenderness, or signs of infection (pain, swelling, redness, odor or green/yellow discharge around incision site)    Complete by:  As directed    Call MD for:  severe uncontrolled pain    Complete by:  As directed    Call MD for:  temperature >100.4    Complete by:  As directed    Diet general    Complete by:  As directed    Driving Restrictions    Complete by:  As directed    No driving for 2 weeks   Increase activity slowly    Complete by:  As directed    Sexual Activity Restrictions    Complete by:  As directed    Nothing in the vagina for 6 weeks     Allergies as of 05/18/2017      Reactions   Adhesive [tape]    Pt states allerigic to steri strips,  causes blister and redness.      Medication List    TAKE these medications   acetaminophen 325 MG tablet Commonly known as:  TYLENOL Take 650 mg by mouth every 6 (six) hours as needed for moderate pain or fever.   amoxicillin-clavulanate 875-125 MG tablet Commonly known as:  AUGMENTIN Take 1 tablet by mouth 2 (two) times daily.   FISH OIL PO Take 1 capsule by mouth every morning.   fluconazole 150 MG tablet Commonly known as:  DIFLUCAN Take 1 tablet (150 mg total) by mouth daily.   ibuprofen 600 MG tablet Commonly known as:  ADVIL,MOTRIN Take 1 tablet (600 mg total) by mouth every 6 (six) hours as needed (mild pain). What changed:  medication strength  reasons to take this   oxyCODONE-acetaminophen 5-325 MG tablet Commonly known as:  PERCOCET/ROXICET Take 1-2 tablets by mouth every 3 (three) hours as needed (moderate to severe pain (when tolerating fluids)).   prenatal multivitamin Tabs tablet Take 1 tablet by mouth daily.   sertraline 100 MG tablet Commonly known as:  ZOLOFT Take 100 mg by mouth daily.        Signed: Kolston Lacount C 05/18/2017, 9:16 AM

## 2017-05-18 NOTE — Progress Notes (Signed)
Subjective: Patient reports tolerating PO, + flatus and no problems voiding.    Objective: I have reviewed patient's vital signs, intake and output and labs.  General: alert, cooperative, appears stated age and no distress GI: soft, non-tender; bowel sounds normal; no masses,  no organomegaly   Assessment/Plan: Post op endometritis Hematometria - now feeling better will d/Church home  LOS: 2 days    Kristin Church 05/18/2017, 9:13 AM

## 2017-06-08 ENCOUNTER — Encounter (HOSPITAL_COMMUNITY): Payer: Self-pay

## 2017-06-08 ENCOUNTER — Encounter (HOSPITAL_COMMUNITY)
Admission: RE | Admit: 2017-06-08 | Discharge: 2017-06-08 | Disposition: A | Discharge status: Home / Self Care | Payer: BLUE CROSS/BLUE SHIELD | Source: Ambulatory Visit | Attending: Obstetrics and Gynecology | Admitting: Obstetrics and Gynecology

## 2017-06-08 DIAGNOSIS — Z01812 Encounter for preprocedural laboratory examination: Secondary | ICD-10-CM | POA: Insufficient documentation

## 2017-06-08 HISTORY — DX: Cardiac murmur, unspecified: R01.1

## 2017-06-08 HISTORY — DX: Umbilical hernia without obstruction or gangrene: K42.9

## 2017-06-08 HISTORY — DX: Major depressive disorder, single episode, unspecified: F32.9

## 2017-06-08 HISTORY — DX: Depression, unspecified: F32.A

## 2017-06-08 HISTORY — DX: Essential (primary) hypertension: I10

## 2017-06-08 LAB — CBC
HCT: 38.8 % (ref 36.0–46.0)
HEMOGLOBIN: 13.2 g/dL (ref 12.0–15.0)
MCH: 29.9 pg (ref 26.0–34.0)
MCHC: 34 g/dL (ref 30.0–36.0)
MCV: 88 fL (ref 78.0–100.0)
PLATELETS: 260 10*3/uL (ref 150–400)
RBC: 4.41 MIL/uL (ref 3.87–5.11)
RDW: 13.8 % (ref 11.5–15.5)
WBC: 10.4 10*3/uL (ref 4.0–10.5)

## 2017-06-08 LAB — TYPE AND SCREEN
ABO/RH(D): O POS
Antibody Screen: NEGATIVE

## 2017-06-08 NOTE — Patient Instructions (Signed)
Your procedure is scheduled on:  Wednesday, Oct. 31, 2018  Enter through the [employer] of [facility] [place] at:  6:00AM  Pick up the phone at the desk and dial [phone].  Call this number if you have problems the morning of surgery: [phone].  Remember: Do NOT eat food or drink after:  Midnight Tuesday  Take these medicines the morning of surgery with a SIP OF WATER:  Sertraline  Stop ALL herbal medications at this time  Do NOT smoke the day of surgery.  Do NOT wear jewelry (body piercing), metal hair clips/bobby pins, make-up, artifical eyelashes or nail polish. Do NOT wear lotions, powders, or perfumes.  You may wear deodorant. Do NOT shave for 48 hours prior to surgery. Do NOT bring valuables to the hospital. Contacts, dentures, or bridgework may not be worn into surgery.  Leave suitcase in car.  After surgery it may be brought to your room.  For patients admitted to the hospital, checkout time is 11:00 AM the day of discharge.  Bring a copy of your healthcare power of attorney and living will documents.

## 2017-06-13 ENCOUNTER — Other Ambulatory Visit: Payer: Self-pay

## 2017-06-13 ENCOUNTER — Emergency Department (HOSPITAL_COMMUNITY): Payer: BLUE CROSS/BLUE SHIELD

## 2017-06-13 ENCOUNTER — Encounter (HOSPITAL_COMMUNITY): Payer: Self-pay | Admitting: Emergency Medicine

## 2017-06-13 ENCOUNTER — Emergency Department (HOSPITAL_COMMUNITY)
Admission: EM | Admit: 2017-06-13 | Discharge: 2017-06-13 | Disposition: A | Discharge status: Home / Self Care | Payer: BLUE CROSS/BLUE SHIELD | Attending: Physician Assistant | Admitting: Physician Assistant

## 2017-06-13 DIAGNOSIS — Z79899 Other long term (current) drug therapy: Secondary | ICD-10-CM | POA: Diagnosis not present

## 2017-06-13 DIAGNOSIS — R55 Syncope and collapse: Secondary | ICD-10-CM

## 2017-06-13 DIAGNOSIS — I10 Essential (primary) hypertension: Secondary | ICD-10-CM | POA: Diagnosis not present

## 2017-06-13 DIAGNOSIS — R9431 Abnormal electrocardiogram [ECG] [EKG]: Secondary | ICD-10-CM | POA: Diagnosis not present

## 2017-06-13 DIAGNOSIS — K529 Noninfective gastroenteritis and colitis, unspecified: Secondary | ICD-10-CM

## 2017-06-13 LAB — HEPATIC FUNCTION PANEL
ALT: 15 U/L (ref 14–54)
AST: 22 U/L (ref 15–41)
Albumin: 4.2 g/dL (ref 3.5–5.0)
Alkaline Phosphatase: 55 U/L (ref 38–126)
BILIRUBIN DIRECT: 0.1 mg/dL (ref 0.1–0.5)
BILIRUBIN INDIRECT: 0.7 mg/dL (ref 0.3–0.9)
BILIRUBIN TOTAL: 0.8 mg/dL (ref 0.3–1.2)
Total Protein: 6.9 g/dL (ref 6.5–8.1)

## 2017-06-13 LAB — CBC
HCT: 37.6 % (ref 36.0–46.0)
Hemoglobin: 13.1 g/dL (ref 12.0–15.0)
MCH: 30.6 pg (ref 26.0–34.0)
MCHC: 34.8 g/dL (ref 30.0–36.0)
MCV: 87.9 fL (ref 78.0–100.0)
PLATELETS: 243 10*3/uL (ref 150–400)
RBC: 4.28 MIL/uL (ref 3.87–5.11)
RDW: 13.8 % (ref 11.5–15.5)
WBC: 7.2 10*3/uL (ref 4.0–10.5)

## 2017-06-13 LAB — BASIC METABOLIC PANEL
Anion gap: 9 (ref 5–15)
BUN: 9 mg/dL (ref 6–20)
CHLORIDE: 106 mmol/L (ref 101–111)
CO2: 22 mmol/L (ref 22–32)
CREATININE: 0.78 mg/dL (ref 0.44–1.00)
Calcium: 9.1 mg/dL (ref 8.9–10.3)
GFR calc Af Amer: >60 mL/min (ref >60)
GFR calc non Af Amer: >60 mL/min (ref >60)
GLUCOSE: 86 mg/dL (ref 65–99)
Potassium: 3.8 mmol/L (ref 3.5–5.1)
SODIUM: 137 mmol/L (ref 135–145)

## 2017-06-13 LAB — TROPONIN I

## 2017-06-13 LAB — PHOSPHORUS: PHOSPHORUS: 2.6 mg/dL (ref 2.5–4.6)

## 2017-06-13 LAB — ETHANOL

## 2017-06-13 LAB — MAGNESIUM: MAGNESIUM: 1.8 mg/dL (ref 1.7–2.4)

## 2017-06-13 LAB — I-STAT BETA HCG BLOOD, ED (MC, WL, AP ONLY)

## 2017-06-13 MED ORDER — ONDANSETRON HCL 4 MG/2ML IJ SOLN
4.0000 mg | Freq: Once | INTRAMUSCULAR | Status: AC
  Administered 2017-06-13: 4 mg via INTRAVENOUS
  Filled 2017-06-13: qty 2

## 2017-06-13 MED ORDER — IOPAMIDOL (ISOVUE-300) INJECTION 61%
INTRAVENOUS | Status: AC
  Administered 2017-06-13: 100 mL
  Filled 2017-06-13: qty 100

## 2017-06-13 MED ORDER — SODIUM CHLORIDE 0.9 % IV BOLUS (SEPSIS)
1000.0000 mL | Freq: Once | INTRAVENOUS | Status: AC
  Administered 2017-06-13: 1000 mL via INTRAVENOUS

## 2017-06-13 MED ORDER — MORPHINE SULFATE (PF) 4 MG/ML IV SOLN
4.0000 mg | Freq: Once | INTRAVENOUS | Status: AC
  Administered 2017-06-13: 4 mg via INTRAVENOUS
  Filled 2017-06-13: qty 1

## 2017-06-13 NOTE — ED Notes (Signed)
Pt refused repeat EKG. Stated she wanted her IV out and to leave without any medications. MD aware and at bedside.

## 2017-06-13 NOTE — Consult Note (Addendum)
Medical Consultation   Kristin Church  BOF:[id number]  DOB: Oct 15, 1975  DOA: 06/13/2017  PCP: Patient, No Pcp Per   Requesting physician: Dr. Thomasene Lot  Reason for consultation: Syncope   History of Present Illness: Kristin Church is an 41 y.o. female with abnormal uterine bleeding and pelvic pain, worse after an ablation.  She had been scheduled for hysterectomy tomorrow.  Patient had syncopal episode at home today.  Onset on standing from seated position.  As she stood up she had severe pelvic pain and next thing she knew she was on the floor.  No CP, no SOB.  No vomiting, no diarrhea.  Has been nauseous all day.  There is initial concern that she may have QTc prolongation based on EKG reads in the ED.   Review of Systems:  ROS As per HPI otherwise 10 point review of systems negative.    Past Medical History: Past Medical History:  Diagnosis Date  . AMA (advanced maternal age) multigravida 75+   . Depression   . Headache    Migraines  . Heart murmur    childhood  . Hx of varicella   . Hypertension    recently noticed increase, no medicatons at this time  . Palpitations    Of questionable significance  . Pre-syncope    Probably multifactorial and long standing in duration  . Umbilical hernia     Past Surgical History: Past Surgical History:  Procedure Laterality Date  . ABLATION    . CESAREAN SECTION  07/22/2012   Procedure: CESAREAN SECTION;  Surgeon: Luz Lex, MD;  Location: [facility];  Service: Obstetrics;  Laterality: N/A;  . CESAREAN SECTION WITH BILATERAL TUBAL LIGATION Bilateral 03/25/2016   Procedure: REPEAT CESAREAN SECTION WITH BILATERAL TUBAL LIGATION;  Surgeon: Louretta Shorten, MD;  Location: [facility];  Service: Obstetrics;  Laterality: Bilateral;  REPEAT EDC 03/28/16 ALLERGIC TO STERI STRIPS [place] to RNFA  . DILATION AND CURETTAGE OF UTERUS    . HERNIA REPAIR  12/2016  . WISDOM TOOTH EXTRACTION       Allergies:     Allergies  Allergen Reactions  . Adhesive [Tape] Rash and Other (See Comments)    Steri-strips cause blisters and redness  . Fructose Hives, Diarrhea and Nausea And Vomiting  . Other Hives, Diarrhea and Nausea And Vomiting    ALL PRESERVATIVES!!     Social History:  reports that she has never smoked. She has never used smokeless tobacco. She reports that she drinks alcohol. She reports that she does not use drugs.   Family History: Family History  Problem Relation Age of Onset  . Hypertension Mother   . Mitral valve prolapse Mother   . Arthritis Mother   . Rheum arthritis Mother   . Hypertension Father   . Cancer Maternal Grandmother        breast  . Cancer Maternal Grandfather        colon  . Heart disease Paternal Grandfather   . Diabetes Paternal Grandfather      Physical Exam: Vitals:   06/13/17 1630 06/13/17 1700 06/13/17 1730 06/13/17 1800  BP: 133/73 125/69 128/68 (!) 142/81  Pulse: 65 69  64  Resp: 13 15  18   Temp:      TempSrc:      SpO2: 100% 100%  100%    Constitutional: Alert and awake, oriented x3, not in any acute distress.  Eyes: PERLA, EOMI, irises appear normal, anicteric sclera,  ENMT: external ears and nose appear normal,            Lips appears normal, oropharynx mucosa, tongue, posterior pharynx appear normal  Neck: neck appears normal, no masses, normal ROM, no thyromegaly, no JVD  CVS: S1-S2 clear, no murmur rubs or gallops, no LE edema, normal pedal pulses  Respiratory:  clear to auscultation bilaterally, no wheezing, rales or rhonchi. Respiratory effort normal. No accessory muscle use.  Abdomen: soft nontender, nondistended, normal bowel sounds, no hepatosplenomegaly, no hernias  Musculoskeletal: : no cyanosis, clubbing or edema noted bilaterally Neuro: Cranial nerves II-XII intact, strength, sensation, reflexes Psych: judgement and insight appear normal, stable mood and affect, mental status Skin: no rashes or lesions or ulcers, no  induration or nodules   Data reviewed:  I have personally reviewed following labs and imaging studies Labs:  CBC:  Recent Labs Lab 06/08/17 1335 06/13/17 1452  WBC 10.4 7.2  HGB 13.2 13.1  HCT 38.8 37.6  MCV 88.0 87.9  PLT 260 657    Basic Metabolic Panel:  Recent Labs Lab 06/13/17 1452 06/13/17 1600  NA 137  --   K 3.8  --   CL 106  --   CO2 22  --   GLUCOSE 86  --   BUN 9  --   CREATININE 0.78  --   CALCIUM 9.1  --   MG  --  1.8  PHOS  --  2.6   GFR Estimated Creatinine Clearance: 73.9 mL/min (by C-G formula based on SCr of 0.78 mg/dL). Liver Function Tests:  Recent Labs Lab 06/13/17 1500  AST 22  ALT 15  ALKPHOS 55  BILITOT 0.8  PROT 6.9  ALBUMIN 4.2   No results for input(s): LIPASE, AMYLASE in the last 168 hours. No results for input(s): AMMONIA in the last 168 hours. Coagulation profile No results for input(s): INR, PROTIME in the last 168 hours.  Cardiac Enzymes:  Recent Labs Lab 06/13/17 1530  TROPONINI <0.03   BNP: Invalid input(s): POCBNP CBG: No results for input(s): GLUCAP in the last 168 hours. D-Dimer No results for input(s): DDIMER in the last 72 hours. Hgb A1c No results for input(s): HGBA1C in the last 72 hours. Lipid Profile No results for input(s): CHOL, HDL, LDLCALC, TRIG, CHOLHDL, LDLDIRECT in the last 72 hours. Thyroid function studies No results for input(s): TSH, T4TOTAL, T3FREE, THYROIDAB in the last 72 hours.  Invalid input(s): FREET3 Anemia work up No results for input(s): VITAMINB12, FOLATE, FERRITIN, TIBC, IRON, RETICCTPCT in the last 72 hours. Urinalysis    Component Value Date/Time   COLORURINE STRAW (A) 05/16/2017 1235   APPEARANCEUR CLEAR 05/16/2017 1235   LABSPEC 1.005 05/16/2017 1235   PHURINE 5.0 05/16/2017 1235   GLUCOSEU NEGATIVE 05/16/2017 1235   HGBUR NEGATIVE 05/16/2017 [place] 05/16/2017 1235   KETONESUR NEGATIVE 05/16/2017 1235   PROTEINUR NEGATIVE 05/16/2017 1235    NITRITE NEGATIVE 05/16/2017 [place] 05/16/2017 1235     Microbiology No results found for this or any previous visit (from the past 240 hour(s)).     Inpatient Medications:   Scheduled Meds: Continuous Infusions:   Radiological Exams on Admission: Ct Abdomen Pelvis W Contrast  Result Date: 06/13/2017 CLINICAL DATA:  Lower abdominal pain with nausea ongoing for month with increased severity last evening. History of umbilical hernia. EXAM: CT ABDOMEN AND PELVIS WITH CONTRAST TECHNIQUE: Multidetector CT imaging of the abdomen and  pelvis was performed using the standard protocol following bolus administration of intravenous contrast. CONTRAST:  <See Chart> ISOVUE-300 IOPAMIDOL (ISOVUE-300) INJECTION 61% COMPARISON:  None. FINDINGS: Lower chest: No acute abnormality. Hepatobiliary: No focal liver abnormality is seen. No gallstones, gallbladder wall thickening, or biliary dilatation. Pancreas: Unremarkable. No pancreatic ductal dilatation or surrounding inflammatory changes. Spleen: Normal in size without focal abnormality. Adrenals/Urinary Tract: Adrenal glands are unremarkable. Kidneys are normal, without renal calculi, focal lesion, or hydronephrosis. Bladder is unremarkable. Stomach/Bowel: The stomach is contracted. Mild fluid-filled distention proximal small bowel loops may represent an enteritis. No mechanical obstruction or inflammation. The appendix is not definitively identified but no pericecal lymph nodes noted. Vascular/Lymphatic: No significant vascular findings are present. No enlarged abdominal or pelvic lymph nodes. Reproductive: Fibroid uterus with an intramural 8 mm anterior left-sided uterine fibroid noted. Endometrial cavity is slightly fluid-filled which could be due to the phase of menstrual cycle. No adnexal masses. Other: Midline ventral scarring presumably from prior ventral hernia repair and previous Cesarean sections. Musculoskeletal: No acute or  significant osseous findings. IMPRESSION: 1. Mild fluid-filled distention of proximal small bowel loops may represent small bowel enteritis. No mechanical obstruction. 2. Midline ventral abdominal scarring without hernia. Electronically Signed   By: Ashley Royalty M.D.   On: 06/13/2017 17:53            Impression/Recommendations Principal Problem:   Syncope, vasovagal  1. Syncope, likely vasovagal associated with pelvic pain - 1. On my review of the rhythm strip, the T wave ends at less than half the distance from R to R, thus I do not think she has QTc prolongation. 2. I believe that the EKG machine was unable to correctly calculate the QTc due to her diffuse flattening / low voltage T waves. 3. From my discussion with the EDP; my understanding is that her Ob/Gyn wants her cleared by cardiology at this point. 4. Offered observation admission for syncope to try and get cardiology to clear her more quickly. 5. If patient accepts, then will admit to tele.  Cards eval in AM.   Thank you for this consultation.  Our [facility] hospitalist team will follow the patient with you.   Time Spent: 80 min  Reather Steller M. D.O. [facility] 06/13/2017, 9:11 PM

## 2017-06-13 NOTE — ED Notes (Addendum)
Pt was given water and crackers, pt also taken herself off the monitor.

## 2017-06-13 NOTE — Discharge Instructions (Addendum)
Please follow up with your OBGYN for your appointment, as planned, tomorrow.  Due to the findings on your EKG, antinausea medications can not be prescribed.  Please follow up on your EKG abnormality with cardiology.  Call the number provided to set up an appointment. Please return to the ED for worsening symptoms, chest pain, shortness of breath, syncope, fever, persistent vomiting, or any other major concerns.

## 2017-06-13 NOTE — ED Notes (Signed)
This EMT went to go walk pt, pt stated "i want this IV out and im walking out of here." Pt was asked if she is not going to wait to speak with the doctor and ger her papers, pt stated "GET THE IV OUT." This EMT told pt that I needed to get the nurse.

## 2017-06-13 NOTE — ED Provider Notes (Signed)
[facility] EMERGENCY DEPARTMENT Provider Note   CSN: [id number] Arrival date & time: 06/13/17  1444     History   Chief Complaint Chief Complaint  Patient presents with  . Loss of Consciousness    HPI Kristin Church is a 41 y.o. female.  HPI   Kristin Church is a 41 y.o. female, with a history of HTN, childhood heart murmur, presenting to the ED following a syncopal episode.  Also complains of abdominal pain.  Has been going on for the last month, but worsened momentarily today.  Current pain is suprapubic, cramping, 10/10, nonradiating.  Vaginal bleeding was heavy 3-4 days ago, but has "tapered off" today.  States has been feeling dizzy and nauseous all day today. Stood up from her desk, took a couple steps, experienced an increase in her abdominal pain, and then lost consciousness for unknown amount of time.  No confusion upon waking.  Denies fever/chills, vomiting, diarrhea, chest pain, shortness of breath, palpitations, neck/back pain, urinary complaints, neuro deficits, or any other complaints.  OBGYN is Dr. Corinna Capra. States she is scheduled for a hysterectomy tomorrow.  Had a D&C and an ablation end of September.  Patient endorses heavy vaginal bleeding intermittently since her last child was born August 2017.  This bleeding and abdominal pain worsened since the procedures in September.    Past Medical History:  Diagnosis Date  . AMA (advanced maternal age) multigravida 31+   . Depression   . Headache    Migraines  . Heart murmur    childhood  . Hx of varicella   . Hypertension    recently noticed increase, no medicatons at this time  . Palpitations    Of questionable significance  . Pre-syncope    Probably multifactorial and long standing in duration  . Umbilical hernia     Patient Active Problem List   Diagnosis Date Noted  . Syncope, vasovagal 06/13/2017  . Post op infection 05/16/2017  . Cesarean delivery delivered 03/25/2016     Past Surgical History:  Procedure Laterality Date  . ABLATION    . CESAREAN SECTION  07/22/2012   Procedure: CESAREAN SECTION;  Surgeon: Luz Lex, MD;  Location: [facility];  Service: Obstetrics;  Laterality: N/A;  . CESAREAN SECTION WITH BILATERAL TUBAL LIGATION Bilateral 03/25/2016   Procedure: REPEAT CESAREAN SECTION WITH BILATERAL TUBAL LIGATION;  Surgeon: Louretta Shorten, MD;  Location: [facility];  Service: Obstetrics;  Laterality: Bilateral;  REPEAT EDC 03/28/16 ALLERGIC TO STERI STRIPS Ziyana K to RNFA  . DILATION AND CURETTAGE OF UTERUS    . HERNIA REPAIR  12/2016  . WISDOM TOOTH EXTRACTION      OB History    Gravida Para Term Preterm AB Living   2 2 2     2    SAB TAB Ectopic Multiple Live Births         0 2       Home Medications    Prior to Admission medications   Medication Sig Start Date End Date Taking? Authorizing Provider  ferrous sulfate 325 (65 FE) MG EC tablet Take 325 mg by mouth at bedtime.   Yes [provider]  Omega-3 Fatty Acids (FISH OIL PO) Take 1 capsule by mouth daily.    Yes [provider]  oxyCODONE-acetaminophen (PERCOCET/ROXICET) 5-325 MG tablet Take 1-2 tablets by mouth every 3 (three) hours as needed (moderate to severe pain (when tolerating fluids)). 05/18/17  Yes Louretta Shorten, MD  Prenatal Vit-Fe  Fumarate-FA (PRENATAL MULTIVITAMIN) TABS Take 1 tablet by mouth daily.   Yes [provider]  sertraline (ZOLOFT) 100 MG tablet Take 100 mg by mouth daily.   Yes [provider]  ibuprofen (ADVIL,MOTRIN) 600 MG tablet Take 1 tablet (600 mg total) by mouth every 6 (six) hours as needed (mild pain). Patient not taking: Reported on 06/13/2017 05/18/17   Louretta Shorten, MD  ibuprofen (ADVIL,MOTRIN) 800 MG tablet Take 800 mg by mouth every 8 (eight) hours as needed for pain. 06/06/17   [provider]    Family History Family History  Problem Relation Age of Onset  . Hypertension Mother   . Mitral valve  prolapse Mother   . Arthritis Mother   . Rheum arthritis Mother   . Hypertension Father   . Cancer Maternal Grandmother        breast  . Cancer Maternal Grandfather        colon  . Heart disease Paternal Grandfather   . Diabetes Paternal Grandfather     Social History Social History  Substance Use Topics  . Smoking status: Never Smoker  . Smokeless tobacco: Never Used  . Alcohol use Yes     Allergies   Adhesive [tape]; Fructose; and Other   Review of Systems Review of Systems  Constitutional: Negative for diaphoresis and fever.  Respiratory: Negative for shortness of breath.   Cardiovascular: Negative for chest pain.  Gastrointestinal: Positive for abdominal pain and nausea. Negative for diarrhea and vomiting.  Genitourinary: Positive for vaginal bleeding. Negative for dysuria and frequency.  Musculoskeletal: Negative for back pain and neck pain.  Neurological: Positive for dizziness and syncope. Negative for weakness, numbness and headaches.  All other systems reviewed and are negative.    Physical Exam Updated Vital Signs BP (!) 144/93   Pulse 83   Resp (!) 23   SpO2 100%   Physical Exam  Constitutional: She is oriented to person, place, and time. She appears well-developed and well-nourished. She appears distressed (pain).  HENT:  Head: Normocephalic and atraumatic.  Mouth/Throat: Oropharynx is clear and moist.  No noted tenderness, swelling, wounds, deformity, or instability noted to the scalp or face.  Eyes: Pupils are equal, round, and reactive to light. Conjunctivae and EOM are normal.  Neck: Normal range of motion. Neck supple.  Cardiovascular: Normal rate, regular rhythm, normal heart sounds and intact distal pulses.   Pulmonary/Chest: Effort normal and breath sounds normal. No respiratory distress.  No increased work of breathing noted.  Abdominal: Soft. Bowel sounds are normal. There is tenderness. There is no rigidity, no rebound and no guarding.     Musculoskeletal: She exhibits no edema.  Normal motor function intact in all extremities and spine. No midline spinal tenderness.   Lymphadenopathy:    She has no cervical adenopathy.  Neurological: She is alert and oriented to person, place, and time.  No sensory deficits.  No noted speech deficits. No aphasia. Patient handles oral secretions without difficulty. No noted swallowing defects.  Equal grip strength bilaterally. Strength 5/5 in the upper extremities. Strength 5/5 with flexion and extension of the hips, knees, and ankles bilaterally.  Patient refused to walk. No upright ataxia. Coordination intact. Cranial nerves III-XII grossly intact.  No facial droop.   Skin: Skin is warm and dry. Capillary refill takes less than 2 seconds. She is not diaphoretic. No pallor.  Psychiatric: She has a normal mood and affect. Her behavior is normal.  Nursing note and vitals reviewed.  ED Treatments / Results  Labs (all labs ordered are listed, but only abnormal results are displayed) Labs Reviewed  BASIC METABOLIC PANEL  CBC  HEPATIC FUNCTION PANEL  ETHANOL  TROPONIN I  MAGNESIUM  PHOSPHORUS  I-STAT BETA HCG BLOOD, ED ([facility], [facility], [facility] ONLY)    EKG  EKG Interpretation  Date/Time:  Tuesday June 13 2017 14:50:57 EDT Ventricular Rate:  89 PR Interval:    QRS Duration: 62 QT Interval:  455 QTC Calculation: 554 R Axis:   79 Text Interpretation:  Sinus rhythm Nonspecific T abnrm, anterolateral leads Prolonged QT interval prolonged QT 550 Confirmed by Thomasene Lot, Courteney [phone]) on 06/13/2017 7:00:44 PM       EKG Interpretation  Date/Time:  Tuesday June 13 2017 14:50:57 EDT Ventricular Rate:  89 PR Interval:    QRS Duration: 62 QT Interval:  455 QTC Calculation: 554 R Axis:   79 Text Interpretation:  Sinus rhythm Nonspecific T abnrm, anterolateral leads Prolonged QT interval prolonged QT 550 Confirmed by Mackuen, Courteney [phone]) on 06/13/2017 7:00:44 PM        Radiology Ct Abdomen Pelvis W Contrast  Result Date: 06/13/2017 CLINICAL DATA:  Lower abdominal pain with nausea ongoing for month with increased severity last evening. History of umbilical hernia. EXAM: CT ABDOMEN AND PELVIS WITH CONTRAST TECHNIQUE: Multidetector CT imaging of the abdomen and pelvis was performed using the standard protocol following bolus administration of intravenous contrast. CONTRAST:  <See Chart> ISOVUE-300 IOPAMIDOL (ISOVUE-300) INJECTION 61% COMPARISON:  None. FINDINGS: Lower chest: No acute abnormality. Hepatobiliary: No focal liver abnormality is seen. No gallstones, gallbladder wall thickening, or biliary dilatation. Pancreas: Unremarkable. No pancreatic ductal dilatation or surrounding inflammatory changes. Spleen: Normal in size without focal abnormality. Adrenals/Urinary Tract: Adrenal glands are unremarkable. Kidneys are normal, without renal calculi, focal lesion, or hydronephrosis. Bladder is unremarkable. Stomach/Bowel: The stomach is contracted. Mild fluid-filled distention proximal small bowel loops may represent an enteritis. No mechanical obstruction or inflammation. The appendix is not definitively identified but no pericecal lymph nodes noted. Vascular/Lymphatic: No significant vascular findings are present. No enlarged abdominal or pelvic lymph nodes. Reproductive: Fibroid uterus with an intramural 8 mm anterior left-sided uterine fibroid noted. Endometrial cavity is slightly fluid-filled which could be due to the phase of menstrual cycle. No adnexal masses. Other: Midline ventral scarring presumably from prior ventral hernia repair and previous Cesarean sections. Musculoskeletal: No acute or significant osseous findings. IMPRESSION: 1. Mild fluid-filled distention of proximal small bowel loops may represent small bowel enteritis. No mechanical obstruction. 2. Midline ventral abdominal scarring without hernia. Electronically Signed   By: Ashley Royalty M.D.   On:  06/13/2017 17:53    Procedures Procedures (including critical care time)  Medications Ordered in ED Medications  morphine 4 MG/ML injection 4 mg (4 mg Intravenous Given 06/13/17 1514)  ondansetron (ZOFRAN) injection 4 mg (4 mg Intravenous Given 06/13/17 1514)  sodium chloride 0.9 % bolus 1,000 mL (0 mLs Intravenous Stopped 06/13/17 1616)  iopamidol (ISOVUE-300) 61 % injection (100 mLs  Contrast Given 06/13/17 1724)     Initial Impression / Assessment and Plan / ED Course  I have reviewed the triage vital signs and the nursing notes.  Pertinent labs & imaging results that were available during my care of the patient were reviewed by me and considered in my medical decision making (see chart for details).  Clinical Course as of Jun 14 205  Tue Jun 13, 2017  1559 Spoke with Dr. Lynnette Caffey, on call for [facility]. Recommends  abdominal CT and then would like to be contacted back directly. No need for pelvic exam.  [SJ]  1602 Spoke with patient and updated her on lab results and plan of care. Agrees to the plan. Pain improved to 4/10. Nausea resolved.  [SJ]  1800 Spoke with Dr. Lynnette Caffey and discussed CT findings. States she discussed case with Dr. Corinna Capra. They will plan to proceed with hysterectomy tomorrow. Can treat with with any medications that we choose.  [SJ]  8756 Discussed imaging results and Dr. Lynnette Caffey' recommendation with the patient.  Patient is upset that she has to demonstrate that she can eat and drink.  She states that her pain is manageable and she wants to go home.  She states she can take Tylenol, but does not want Vicodin because it makes her "feel weird."  [SJ]  2002 Spoke with Dr. Alcario Drought, hospitalist.  Agrees to admit the patient.  [SJ]    Clinical Course User Index [SJ] Ellenora Talton C, PA-C    Patient presents for evaluation following a syncopal episode. Patient is nontoxic appearing, afebrile, not tachycardic, not tachypneic, not hypotensive, and maintains SPO2 of  100% on room air. No noted neuro deficits.  Questionable area of enteritis on CT, but no vomiting or diarrhea. Nontender and no pain in the region listed on CT.   Prolonged QTc noted on EKG and due to this, in the setting of syncope, admission recommended.   Patient wavered and argued through her ED course, agreeing with a course of treatment and then arguing with that same course of treatment.  Patient initially agreed to admission with the importance of the prolonged QTc finding explained to the patient, however, when the hospitalist offered to admit patient for observation and inpatient cardiology consult, she refused and demanded to leave. It was explained to the patient that our medical recommendation was for admission and subsequent cardiology evaluation.  It was further explained to the patient that prolonged QTc could lead to injury or death.  Patient acknowledged this and still in demanded to leave. Declined analgesic medications other than Tylenol stating, "I do not like the way they make me feel."  Patient expressed her displeasure at a multitude of things during the ED course.  She was displeased with the fact that EMS brought her to [facility] rather than [facility]. She was displeased when my exam included evaluation beyond her abdomen, despite my explanation. She was displeased at the recommendation for a CT scan from the OBGYN. She was displeased that the OBGYN did not come see her personally here at [facility], despite our explanation that it would be unusual for the OBGYN to come to [facility] to evaluate her.  Findings and plan of care discussed with Zenovia Jarred, MD. Dr. Thomasene Lot personally evaluated and examined this patient.  Vitals:   06/13/17 1451 06/13/17 1500 06/13/17 1537  BP: (!) 144/93 131/86   Pulse: 83 77   Resp: (!) 23 15   Temp:   98.3 F (36.8 C)  TempSrc:   Oral  SpO2: 100% 100%    Vitals:   06/13/17 1700 06/13/17 1730 06/13/17 1800 06/13/17 2151    BP: 125/69 128/68 (!) 142/81 126/85  Pulse: 69  64   Resp: 15  18 18   Temp:      TempSrc:      SpO2: 100%  100%      Final Clinical Impressions(s) / ED Diagnoses   Final diagnoses:  Syncope and collapse  Enteritis  QT  prolongation    New Prescriptions Discharge Medication List as of 06/13/2017  9:40 PM       Lorayne Bender, PA-C 06/14/17 0228    Macarthur Critchley, MD 06/15/17 1754

## 2017-06-13 NOTE — ED Triage Notes (Signed)
Pt arrives via EMS from work with complaints of a syncopal episode. Pt fell from standing and hit head on cabinet, reports LOC. Per pt, pt is scheduled for total hysterectomy at [facility] hospital tomorrow. Reports intense cramps in lower abd and bleeding.

## 2017-06-13 NOTE — H&P (Addendum)
Kristin Church is an 41 y.o. female for a hysterectomy.  She has had abnormal uterine bleeding, pelvic pain.  She is status post ablation.  She also has uterine fibroids.  She was admitted with hematometra after the endometrial ablation.  She continues to have pain and abnormal uterine bleeding.  She desires definitive surgical intervention.  Plan hysterectomy.  She had a previous rectus abdominis repair by a general surgeon.  I spoke with him on the phone discussing different options.  He states it was all extraperitoneal, states that I could go through the incision versus Pfannenstiel so either is an appropriate choice.     Menstrual History: Menarche age:   No LMP recorded. Patient has had an ablation.    Past Medical History:  Diagnosis Date  . AMA (advanced maternal age) multigravida 25+   . Depression   . Headache    Migraines  . Heart murmur    childhood  . Hx of varicella   . Hypertension    recently noticed increase, no medicatons at this time  . Palpitations    Of questionable significance  . Pre-syncope    Probably multifactorial and long standing in duration  . Umbilical hernia     Past Surgical History:  Procedure Laterality Date  . ABLATION    . CESAREAN SECTION  07/22/2012   Procedure: CESAREAN SECTION;  Surgeon: Kristin Lex, MD;  Location: [facility];  Service: Obstetrics;  Laterality: N/A;  . CESAREAN SECTION WITH BILATERAL TUBAL LIGATION Bilateral 03/25/2016   Procedure: REPEAT CESAREAN SECTION WITH BILATERAL TUBAL LIGATION;  Surgeon: Louretta Shorten, MD;  Location: [facility];  Service: Obstetrics;  Laterality: Bilateral;  REPEAT EDC 03/28/16 ALLERGIC TO STERI STRIPS Kristin Church to RNFA  . DILATION AND CURETTAGE OF UTERUS    . HERNIA REPAIR  12/2016  . WISDOM TOOTH EXTRACTION      Family History  Problem Relation Age of Onset  . Hypertension Mother   . Mitral valve prolapse Mother   . Arthritis Mother   . Rheum arthritis Mother   . Hypertension Father    . Cancer Maternal Grandmother        breast  . Cancer Maternal Grandfather        colon  . Heart disease Paternal Grandfather   . Diabetes Paternal Grandfather     Social History:  reports that she has never smoked. She has never used smokeless tobacco. She reports that she drinks alcohol. She reports that she does not use drugs.  Allergies:  Allergies  Allergen Reactions  . Adhesive [Tape] Other (See Comments)    Pt states allerigic to steri strips, causes blister and redness.    No prescriptions prior to admission.    ROS  unknown if currently breastfeeding. Physical Exam    CV RRR Lungs CTAB ABD, soft, nt,nd  Well healed mid line scar Pelvic exam:  The uterus is anteverted, mobile and nontender.  No adnexal masses.  Cervix is well healed.  Ultrasound shows a normal-appearing uterus.  There is a 15 x 11 mm fibroid.  There is no hemato or pyometra.  There is very indistinguishable endometrial lining  No results found for this or any previous visit (from the past 24 hour(s)).  No results found.  Assessment/Plan:   Abnormal uterine bleeding, pelvic pain, status post ablation, uterine fibroids.  Patient desires definitive surgical intervention.  Plan hysterectomy after discussion with the general surgeon and with a lengthy discussion with Kristin Church.  She wants  to proceed with laparoscopic evaluation for possible LAVH and bilateral salpingectomy.  She understands that we may need to go to a Pfannenstiel skin incision for a TAH with bilateral salpingectomy.  Discussed the procedure, its risks, its benefits, its pros, its cons at length.  Discussed preservation of the ovaries.  I did refill her prescription for Motrin 800 mg #30.   Louretta Shorten, MD  Kristin Church 06/13/2017, 2:28 PM   06/14/17 0715 Pt had an eventful yesterday with a fall and ER visit which originally read abnl EKG.  Normal workup and she wants to proceed with surgery.  Anesthesia aware and new EKG and reread of  yesterdays was normal, per them.  This patient has been seen and examined.   All of her questions were answered.  Labs and vital signs reviewed.  Informed consent has been obtained.  The History and Physical is current. DL

## 2017-06-13 NOTE — ED Notes (Signed)
Patient transported to CT 

## 2017-06-14 ENCOUNTER — Inpatient Hospital Stay (HOSPITAL_COMMUNITY): Payer: BLUE CROSS/BLUE SHIELD | Admitting: Anesthesiology

## 2017-06-14 ENCOUNTER — Encounter (HOSPITAL_COMMUNITY)
Admission: AD | Disposition: A | Discharge status: Home / Self Care | Payer: Self-pay | Source: Ambulatory Visit | Attending: Obstetrics and Gynecology

## 2017-06-14 ENCOUNTER — Encounter (HOSPITAL_COMMUNITY): Payer: Self-pay | Admitting: Emergency Medicine

## 2017-06-14 ENCOUNTER — Observation Stay (HOSPITAL_COMMUNITY)
Admission: AD | Admit: 2017-06-14 | Discharge: 2017-06-16 | Disposition: A | Discharge status: Home / Self Care | Payer: BLUE CROSS/BLUE SHIELD | Source: Ambulatory Visit | Attending: Obstetrics and Gynecology | Admitting: Obstetrics and Gynecology

## 2017-06-14 DIAGNOSIS — N802 Endometriosis of fallopian tube: Secondary | ICD-10-CM | POA: Insufficient documentation

## 2017-06-14 DIAGNOSIS — F418 Other specified anxiety disorders: Secondary | ICD-10-CM | POA: Diagnosis not present

## 2017-06-14 DIAGNOSIS — N736 Female pelvic peritoneal adhesions (postinfective): Secondary | ICD-10-CM | POA: Diagnosis not present

## 2017-06-14 DIAGNOSIS — D649 Anemia, unspecified: Secondary | ICD-10-CM | POA: Diagnosis not present

## 2017-06-14 DIAGNOSIS — N72 Inflammatory disease of cervix uteri: Secondary | ICD-10-CM | POA: Diagnosis not present

## 2017-06-14 DIAGNOSIS — N939 Abnormal uterine and vaginal bleeding, unspecified: Secondary | ICD-10-CM | POA: Diagnosis present

## 2017-06-14 DIAGNOSIS — Z90710 Acquired absence of both cervix and uterus: Secondary | ICD-10-CM | POA: Diagnosis present

## 2017-06-14 DIAGNOSIS — D259 Leiomyoma of uterus, unspecified: Principal | ICD-10-CM | POA: Insufficient documentation

## 2017-06-14 DIAGNOSIS — Z79899 Other long term (current) drug therapy: Secondary | ICD-10-CM | POA: Diagnosis not present

## 2017-06-14 HISTORY — DX: Family history of other specified conditions: Z84.89

## 2017-06-14 HISTORY — PX: LAPAROSCOPIC VAGINAL HYSTERECTOMY WITH SALPINGECTOMY: SHX6680

## 2017-06-14 SURGERY — HYSTERECTOMY, VAGINAL, LAPAROSCOPY-ASSISTED, WITH SALPINGECTOMY
Anesthesia: General | Site: Abdomen

## 2017-06-14 MED ORDER — ALUM & MAG HYDROXIDE-SIMETH 200-200-20 MG/5ML PO SUSP: 30.0000 mL | ORAL | Status: DC | PRN

## 2017-06-14 MED ORDER — SUGAMMADEX SODIUM 200 MG/2ML IV SOLN
INTRAVENOUS | Status: DC | PRN
  Administered 2017-06-14: 112 mg via INTRAVENOUS

## 2017-06-14 MED ORDER — SCOPOLAMINE 1 MG/3DAYS TD PT72
1.0000 | MEDICATED_PATCH | Freq: Once | TRANSDERMAL | Status: DC
  Administered 2017-06-14: 1.5 mg via TRANSDERMAL

## 2017-06-14 MED ORDER — LIDOCAINE HCL (CARDIAC) 20 MG/ML IV SOLN
INTRAVENOUS | Status: DC | PRN
  Administered 2017-06-14: 30 mg via INTRAVENOUS
  Administered 2017-06-14: 70 mg via INTRAVENOUS

## 2017-06-14 MED ORDER — OXYCODONE-ACETAMINOPHEN 5-325 MG PO TABS
1.0000 | ORAL_TABLET | ORAL | Status: DC | PRN
  Administered 2017-06-15: 1 via ORAL
  Administered 2017-06-15: 2 via ORAL
  Administered 2017-06-15: 1 via ORAL
  Administered 2017-06-15: 2 via ORAL
  Filled 2017-06-14: qty 1
  Filled 2017-06-14 (×2): qty 2
  Filled 2017-06-14: qty 1

## 2017-06-14 MED ORDER — HYDROMORPHONE HCL 1 MG/ML IJ SOLN
0.5000 mg | INTRAMUSCULAR | Status: AC | PRN
  Administered 2017-06-14 (×2): 0.5 mg via INTRAVENOUS

## 2017-06-14 MED ORDER — ALBUMIN HUMAN 5 % IV SOLN
INTRAVENOUS | Status: DC | PRN
  Administered 2017-06-14: 09:00:00 via INTRAVENOUS

## 2017-06-14 MED ORDER — MENTHOL 3 MG MT LOZG
1.0000 | LOZENGE | OROMUCOSAL | Status: DC | PRN
Start: 2017-06-14 — End: 2017-06-16

## 2017-06-14 MED ORDER — SODIUM CHLORIDE 0.9% FLUSH: 9.0000 mL | INTRAVENOUS | Status: DC | PRN

## 2017-06-14 MED ORDER — DEXTROSE-NACL 5-0.45 % IV SOLN
INTRAVENOUS | Status: DC
  Administered 2017-06-14: 125 mL/h via INTRAVENOUS
  Administered 2017-06-14 – 2017-06-15 (×2): via INTRAVENOUS

## 2017-06-14 MED ORDER — HYDROMORPHONE HCL 1 MG/ML IJ SOLN
INTRAMUSCULAR | Status: AC
  Filled 2017-06-14: qty 1

## 2017-06-14 MED ORDER — MEPERIDINE HCL 25 MG/ML IJ SOLN: 6.2500 mg | INTRAMUSCULAR | Status: DC | PRN

## 2017-06-14 MED ORDER — LIDOCAINE HCL (CARDIAC) 20 MG/ML IV SOLN
INTRAVENOUS | Status: AC
  Filled 2017-06-14: qty 5

## 2017-06-14 MED ORDER — HYDROMORPHONE HCL 1 MG/ML IJ SOLN: 0.2000 mg | INTRAMUSCULAR | Status: DC | PRN

## 2017-06-14 MED ORDER — PROMETHAZINE HCL 25 MG/ML IJ SOLN
12.5000 mg | Freq: Four times a day (QID) | INTRAMUSCULAR | Status: DC | PRN
  Filled 2017-06-14: qty 1

## 2017-06-14 MED ORDER — MIDAZOLAM HCL 2 MG/2ML IJ SOLN
INTRAMUSCULAR | Status: DC | PRN
  Administered 2017-06-14: 1 mg via INTRAVENOUS

## 2017-06-14 MED ORDER — SODIUM CHLORIDE 0.9 % IR SOLN
Status: DC | PRN
  Administered 2017-06-14: 3000 mL

## 2017-06-14 MED ORDER — DIPHENHYDRAMINE HCL 50 MG/ML IJ SOLN
INTRAMUSCULAR | Status: DC | PRN
  Administered 2017-06-14: 12.5 mg via INTRAVENOUS

## 2017-06-14 MED ORDER — PROPOFOL 10 MG/ML IV BOLUS
INTRAVENOUS | Status: DC | PRN
  Administered 2017-06-14: 170 mg via INTRAVENOUS

## 2017-06-14 MED ORDER — DIPHENHYDRAMINE HCL 50 MG/ML IJ SOLN
INTRAMUSCULAR | Status: AC
  Filled 2017-06-14: qty 1

## 2017-06-14 MED ORDER — MIDAZOLAM HCL 2 MG/2ML IJ SOLN: 0.5000 mg | Freq: Once | INTRAMUSCULAR | Status: DC | PRN

## 2017-06-14 MED ORDER — ZOLPIDEM TARTRATE 5 MG PO TABS
5.0000 mg | ORAL_TABLET | Freq: Every evening | ORAL | Status: DC | PRN
Start: 2017-06-14 — End: 2017-06-16

## 2017-06-14 MED ORDER — DEXAMETHASONE SODIUM PHOSPHATE 10 MG/ML IJ SOLN
INTRAMUSCULAR | Status: DC | PRN
  Administered 2017-06-14: 4 mg via INTRAVENOUS

## 2017-06-14 MED ORDER — SCOPOLAMINE 1 MG/3DAYS TD PT72
MEDICATED_PATCH | TRANSDERMAL | Status: AC
  Administered 2017-06-14: 1.5 mg via TRANSDERMAL
  Filled 2017-06-14: qty 1

## 2017-06-14 MED ORDER — KETOROLAC TROMETHAMINE 30 MG/ML IJ SOLN
30.0000 mg | Freq: Once | INTRAMUSCULAR | Status: AC
  Administered 2017-06-14: 30 mg via INTRAVENOUS

## 2017-06-14 MED ORDER — SIMETHICONE 80 MG PO CHEW: 80.0000 mg | CHEWABLE_TABLET | Freq: Four times a day (QID) | ORAL | Status: DC | PRN

## 2017-06-14 MED ORDER — KETOROLAC TROMETHAMINE 30 MG/ML IJ SOLN
INTRAMUSCULAR | Status: AC
  Filled 2017-06-14: qty 1

## 2017-06-14 MED ORDER — LACTATED RINGERS IV SOLN
INTRAVENOUS | Status: DC
  Administered 2017-06-14 (×2): via INTRAVENOUS

## 2017-06-14 MED ORDER — CEFOTETAN DISODIUM-DEXTROSE 2-2.08 GM-%(50ML) IV SOLR
INTRAVENOUS | Status: AC
  Filled 2017-06-14: qty 50

## 2017-06-14 MED ORDER — HYDROMORPHONE HCL 1 MG/ML IJ SOLN
0.2500 mg | INTRAMUSCULAR | Status: DC | PRN
  Administered 2017-06-14 (×4): 0.5 mg via INTRAVENOUS

## 2017-06-14 MED ORDER — PROPOFOL 10 MG/ML IV BOLUS
INTRAVENOUS | Status: AC
  Filled 2017-06-14: qty 20

## 2017-06-14 MED ORDER — FENTANYL CITRATE (PF) 250 MCG/5ML IJ SOLN
INTRAMUSCULAR | Status: AC
  Filled 2017-06-14: qty 5

## 2017-06-14 MED ORDER — HYDROMORPHONE 1 MG/ML IV SOLN
INTRAVENOUS | Status: DC
  Administered 2017-06-14: 2.2 mL via INTRAVENOUS
  Administered 2017-06-14: 12:00:00 via INTRAVENOUS
  Administered 2017-06-14: 2.4 mg via INTRAVENOUS
  Administered 2017-06-15: 1.6 mg via INTRAVENOUS
  Administered 2017-06-15: 0.6 mg via INTRAVENOUS
  Filled 2017-06-14: qty 25

## 2017-06-14 MED ORDER — IBUPROFEN 600 MG PO TABS
600.0000 mg | ORAL_TABLET | Freq: Four times a day (QID) | ORAL | Status: DC | PRN
Start: 2017-06-14 — End: 2017-06-16
  Administered 2017-06-15 – 2017-06-16 (×4): 600 mg via ORAL
  Filled 2017-06-14 (×4): qty 1

## 2017-06-14 MED ORDER — NALOXONE HCL 0.4 MG/ML IJ SOLN: 0.4000 mg | INTRAMUSCULAR | Status: DC | PRN

## 2017-06-14 MED ORDER — ROCURONIUM BROMIDE 100 MG/10ML IV SOLN
INTRAVENOUS | Status: AC
  Filled 2017-06-14: qty 1

## 2017-06-14 MED ORDER — DIPHENHYDRAMINE HCL 12.5 MG/5ML PO ELIX: 12.5000 mg | ORAL_SOLUTION | Freq: Four times a day (QID) | ORAL | Status: DC | PRN

## 2017-06-14 MED ORDER — ROCURONIUM BROMIDE 100 MG/10ML IV SOLN
INTRAVENOUS | Status: DC | PRN
  Administered 2017-06-14: 50 mg via INTRAVENOUS
  Administered 2017-06-14 (×2): 10 mg via INTRAVENOUS

## 2017-06-14 MED ORDER — METOCLOPRAMIDE HCL 5 MG/ML IJ SOLN
INTRAMUSCULAR | Status: AC
  Filled 2017-06-14: qty 2

## 2017-06-14 MED ORDER — BUPIVACAINE HCL (PF) 0.25 % IJ SOLN
INTRAMUSCULAR | Status: DC | PRN
  Administered 2017-06-14: 10 mL

## 2017-06-14 MED ORDER — SERTRALINE HCL 100 MG PO TABS
100.0000 mg | ORAL_TABLET | Freq: Every day | ORAL | Status: DC
  Administered 2017-06-14 – 2017-06-16 (×3): 100 mg via ORAL
  Filled 2017-06-14 (×3): qty 1

## 2017-06-14 MED ORDER — CEFOTETAN DISODIUM-DEXTROSE 2-2.08 GM-%(50ML) IV SOLR
2.0000 g | INTRAVENOUS | Status: AC
  Administered 2017-06-14: 2 g via INTRAVENOUS

## 2017-06-14 MED ORDER — FENTANYL CITRATE (PF) 100 MCG/2ML IJ SOLN
INTRAMUSCULAR | Status: DC | PRN
  Administered 2017-06-14: 50 ug via INTRAVENOUS
  Administered 2017-06-14: 25 ug via INTRAVENOUS
  Administered 2017-06-14: 50 ug via INTRAVENOUS
  Administered 2017-06-14: 75 ug via INTRAVENOUS

## 2017-06-14 MED ORDER — MIDAZOLAM HCL 2 MG/2ML IJ SOLN
INTRAMUSCULAR | Status: AC
  Filled 2017-06-14: qty 2

## 2017-06-14 MED ORDER — DIPHENHYDRAMINE HCL 50 MG/ML IJ SOLN
12.5000 mg | Freq: Four times a day (QID) | INTRAMUSCULAR | Status: DC | PRN
  Administered 2017-06-14: 12.5 mg via INTRAVENOUS
  Filled 2017-06-14: qty 1

## 2017-06-14 MED ORDER — SUGAMMADEX SODIUM 200 MG/2ML IV SOLN
INTRAVENOUS | Status: AC
  Filled 2017-06-14: qty 2

## 2017-06-14 MED ORDER — BUPIVACAINE HCL (PF) 0.25 % IJ SOLN
INTRAMUSCULAR | Status: AC
  Filled 2017-06-14: qty 30

## 2017-06-14 SURGICAL SUPPLY — 69 items
APPLICATOR ARISTA FLEXITIP XL (MISCELLANEOUS) ×4 IMPLANT
CABLE HIGH FREQUENCY MONO STRZ (ELECTRODE) IMPLANT
CANISTER SUCT 3000ML PPV (MISCELLANEOUS) ×4 IMPLANT
CATH ROBINSON RED A/P 16FR (CATHETERS) ×4 IMPLANT
CLOTH BEACON ORANGE TIMEOUT ST (SAFETY) ×4 IMPLANT
CONT PATH 16OZ SNAP LID 3702 (MISCELLANEOUS) ×4 IMPLANT
COVER BACK TABLE 60X90IN (DRAPES) ×4 IMPLANT
DECANTER SPIKE VIAL GLASS SM (MISCELLANEOUS) ×4 IMPLANT
DRAPE WARM FLUID 44X44 (DRAPE) IMPLANT
DRSG OPSITE POSTOP 3X4 (GAUZE/BANDAGES/DRESSINGS) ×4 IMPLANT
DRSG OPSITE POSTOP 4X10 (GAUZE/BANDAGES/DRESSINGS) IMPLANT
DURAPREP 26ML APPLICATOR (WOUND CARE) ×4 IMPLANT
ELECT REM PT RETURN 9FT ADLT (ELECTROSURGICAL) ×4
ELECTRODE REM PT RTRN 9FT ADLT (ELECTROSURGICAL) ×2 IMPLANT
FORCEPS CUTTING 45CM 5MM (CUTTING FORCEPS) ×4 IMPLANT
GAUZE SPONGE 4X4 16PLY XRAY LF (GAUZE/BANDAGES/DRESSINGS) IMPLANT
GLOVE BIO SURGEON STRL SZ8 (GLOVE) ×4 IMPLANT
GLOVE BIOGEL PI IND STRL 6.5 (GLOVE) ×2 IMPLANT
GLOVE BIOGEL PI IND STRL 7.0 (GLOVE) ×6 IMPLANT
GLOVE BIOGEL PI INDICATOR 6.5 (GLOVE) ×2
GLOVE BIOGEL PI INDICATOR 7.0 (GLOVE) ×6
GLOVE SURG ORTHO 8.0 STRL STRW (GLOVE) ×12 IMPLANT
GOWN STRL REUS W/TWL LRG LVL3 (GOWN DISPOSABLE) IMPLANT
HEMOSTAT ARISTA ABSORB 3G PWDR (MISCELLANEOUS) ×4 IMPLANT
LEGGING LITHOTOMY PAIR STRL (DRAPES) ×4 IMPLANT
LIGASURE IMPACT 36 18CM CVD LR (INSTRUMENTS) ×4 IMPLANT
LIGASURE LAP L-HOOKWIRE 5 44CM (INSTRUMENTS) IMPLANT
NEEDLE HYPO 22GX1.5 SAFETY (NEEDLE) IMPLANT
NEEDLE INSUFFLATION 120MM (ENDOMECHANICALS) ×4 IMPLANT
NS IRRIG 1000ML POUR BTL (IV SOLUTION) ×4 IMPLANT
PACK ABDOMINAL GYN (CUSTOM PROCEDURE TRAY) IMPLANT
PACK LAVH (CUSTOM PROCEDURE TRAY) ×4 IMPLANT
PACK ROBOTIC GOWN (GOWN DISPOSABLE) ×4 IMPLANT
PACK TRENDGUARD 450 HYBRID PRO (MISCELLANEOUS) ×2 IMPLANT
PACK TRENDGUARD 600 HYBRD PROC (MISCELLANEOUS) IMPLANT
PAD OB MATERNITY 4.3X12.25 (PERSONAL CARE ITEMS) ×4 IMPLANT
PENCIL SMOKE EVAC W/HOLSTER (ELECTROSURGICAL) IMPLANT
PROTECTOR NERVE ULNAR (MISCELLANEOUS) ×8 IMPLANT
RETRACTOR WND ALEXIS 25 LRG (MISCELLANEOUS) IMPLANT
RTRCTR WOUND ALEXIS 25CM LRG (MISCELLANEOUS)
SET IRRIG TUBING LAPAROSCOPIC (IRRIGATION / IRRIGATOR) ×4 IMPLANT
SLEEVE XCEL OPT CAN 5 100 (ENDOMECHANICALS) IMPLANT
SOLUTION ELECTROLUBE (MISCELLANEOUS) IMPLANT
SPONGE LAP 18X18 X RAY DECT (DISPOSABLE) IMPLANT
SUT MNCRL 0 MO-4 VIOLET 18 CR (SUTURE) ×4 IMPLANT
SUT MNCRL 0 VIOLET 6X18 (SUTURE) ×2 IMPLANT
SUT MNCRL AB 0 CT1 27 (SUTURE) IMPLANT
SUT MNCRL AB 3-0 PS2 27 (SUTURE) IMPLANT
SUT MON AB 2-0 CT1 36 (SUTURE) IMPLANT
SUT MONOCRYL 0 6X18 (SUTURE) ×2
SUT MONOCRYL 0 MO 4 18  CR/8 (SUTURE) ×4
SUT PDS AB 0 CT1 27 (SUTURE) IMPLANT
SUT PDS AB 0 CTX 60 (SUTURE) IMPLANT
SUT PLAIN 3 0 CT 1 27 (SUTURE) IMPLANT
SUT VIC AB 0 CT1 18XCR BRD8 (SUTURE) IMPLANT
SUT VIC AB 0 CT1 8-18 (SUTURE)
SUT VIC AB 1 CTX 36 (SUTURE)
SUT VIC AB 1 CTX36XBRD ANBCTRL (SUTURE) IMPLANT
SUT VIC AB 2-0 CT1 (SUTURE) IMPLANT
SUT VICRYL 0 UR6 27IN ABS (SUTURE) ×4 IMPLANT
SUT VICRYL RAPIDE 3 0 (SUTURE) ×4 IMPLANT
SYR CONTROL 10ML LL (SYRINGE) IMPLANT
TOWEL OR 17X24 6PK STRL BLUE (TOWEL DISPOSABLE) ×8 IMPLANT
TRAY FOLEY CATH SILVER 14FR (SET/KITS/TRAYS/PACK) ×4 IMPLANT
TRENDGUARD 450 HYBRID PRO PACK (MISCELLANEOUS) ×4
TRENDGUARD 600 HYBRID PROC PK (MISCELLANEOUS)
TROCAR OPTI TIP 5M 100M (ENDOMECHANICALS) ×4 IMPLANT
TROCAR XCEL DIL TIP R 11M (ENDOMECHANICALS) ×4 IMPLANT
WARMER LAPAROSCOPE (MISCELLANEOUS) ×4 IMPLANT

## 2017-06-14 NOTE — Anesthesia Postprocedure Evaluation (Signed)
Anesthesia Post Note  Patient: Kristin Church  Procedure(s) Performed: LAPAROSCOPIC ASSISTED VAGINAL HYSTERECTOMY WITH SALPINGECTOMY (Bilateral Abdomen)     Patient location during evaluation: PACU Anesthesia Type: General Level of consciousness: awake and alert Pain management: pain level controlled Vital Signs Assessment: post-procedure vital signs reviewed and stable Respiratory status: spontaneous breathing, nonlabored ventilation and respiratory function stable Cardiovascular status: blood pressure returned to baseline and stable Postop Assessment: no apparent nausea or vomiting Anesthetic complications: no    Last Vitals:  Vitals:   06/14/17 1115 06/14/17 1130  BP: 113/62 (!) 114/59  Pulse: 83 83  Resp: 14 16  Temp:  37.1 C  SpO2: 95% 97%    Last Pain:  Vitals:   06/14/17 1145  TempSrc:   PainSc: 6    Pain Goal: Patients Stated Pain Goal: 5 (06/14/17 1130)               Israel Werts A.

## 2017-06-14 NOTE — Transfer of Care (Signed)
Immediate Anesthesia Transfer of Care Note  Patient: Kristin Church  Procedure(s) Performed: LAPAROSCOPIC ASSISTED VAGINAL HYSTERECTOMY WITH SALPINGECTOMY (Bilateral Abdomen)  Patient Location: PACU  Anesthesia Type:General  Level of Consciousness: awake, sedated, drowsy and patient cooperative  Airway & Oxygen Therapy: Patient Spontanous Breathing  Post-op Assessment: Report given to RN and Post -op Vital signs reviewed and stable  Post vital signs: Reviewed and stable  Last Vitals:  Vitals:   06/14/17 0611  BP: 116/76  Pulse: 70  Resp: 16  Temp: 36.8 C  SpO2: 100%    Last Pain:  Vitals:   06/14/17 0611  TempSrc: Oral  PainSc: 4       Patients Stated Pain Goal: 5 (08/07/81 5003)  Complications: No apparent anesthesia complications

## 2017-06-14 NOTE — Anesthesia Preprocedure Evaluation (Addendum)
Anesthesia Evaluation  Patient identified by MRN, date of birth, ID band Patient awake    Reviewed: Allergy & Precautions, NPO status , Patient's Chart, lab work & pertinent test results  History of Anesthesia Complications Negative for: history of anesthetic complications  Airway Mallampati: I  TM Distance: >3 FB Neck ROM: Full    Dental  (+) Teeth Intact, Dental Advisory Given   Pulmonary neg pulmonary ROS,    breath sounds clear to auscultation       Cardiovascular hypertension,  Rhythm:Regular Rate:Normal  ?syncopal event with abdominal pain yesterday: ED work-up was unremarkable, some question of long QT, but EKG was normal '11 ECHO: normal LVF and valves   Neuro/Psych  Headaches, Anxiety Depression    GI/Hepatic Neg liver ROS, Abdominal pain   Endo/Other  negative endocrine ROS  Renal/GU negative Renal ROS     Musculoskeletal   Abdominal   Peds  Hematology negative hematology ROS (+)   Anesthesia Other Findings   Reproductive/Obstetrics                            Anesthesia Physical Anesthesia Plan  ASA: II  Anesthesia Plan: General   Post-op Pain Management:    Induction: Intravenous  PONV Risk Score and Plan: 3 and Dexamethasone, Midazolam, Treatment may vary due to age or medical condition and Scopolamine patch - Pre-op  Airway Management Planned: Oral ETT  Additional Equipment:   Intra-op Plan:   Post-operative Plan: Extubation in OR  Informed Consent: I have reviewed the patients History and Physical, chart, labs and discussed the procedure including the risks, benefits and alternatives for the proposed anesthesia with the patient or authorized representative who has indicated his/her understanding and acceptance.   Dental advisory given  Plan Discussed with: CRNA and Surgeon  Anesthesia Plan Comments: (Plan routine monitors, GETA)        Anesthesia Quick  Evaluation

## 2017-06-14 NOTE — Op Note (Signed)
NAME:  Kristin Church, Kristin Church               ACCOUNT NO.:  [id number]  MEDICAL RECORD NO.:  [id number]  LOCATION:                                 FACILITY:  PHYSICIAN:  Monia Sabal. Corinna Capra, M.D.         DATE OF BIRTH:  DATE OF PROCEDURE:  06/14/2017 DATE OF DISCHARGE:                              OPERATIVE REPORT   PREOPERATIVE DIAGNOSES: 1. Abnormal uterine bleeding. 2. Anemia. 3. Pelvic pain. 4. Fibroids.  POSTOPERATIVE DIAGNOSES: 1. Abnormal uterine bleeding. 2. Anemia. 3. Pelvic pain. 4. Fibroids. 5. Pelvic adhesions.  PROCEDURE:  Laparoscopic-assisted vaginal hysterectomy with bilateral salpingectomy.  SURGEON:  Monia Sabal. Corinna Capra, M.D.  ASSISTANTMaisie Fus, M.D.  ANESTHESIA:  General endotracheal.  INDICATIONS:  Ms. Wooden is a 41 year old with abnormal bleeding, pelvic pain.  She is status post endometrial ablation.  She continues to have capacitating pain and bleeding.  She was admitted recently for hematometra post endometrial ablation bleeding that did not improve her pain.  She desires definitive surgical intervention and presents for hysterectomy.  The risks and benefits of procedure were discussed at length and informed consent was obtained.  By the time of surgery, slightly enlarged uterus, pelvic adhesions due to uterine fibroid and also scar tissue from bladder to the anterior surface of the uterus and pelvic sidewall.  Appendix was retrocecal, normal-appearing liver, and gallbladder.  DESCRIPTION OF PROCEDURE:  After adequate analgesia, the patient was placed in the dorsal lithotomy position.  She was sterilely prepped and draped.  Bladder sterilely drained.  Graves speculum was placed and a Cohen tenaculum was placed on the cervix.  A 1 cm infraumbilical skin incision was made in the previous midline incision.  A Veress needle was inserted.  The abdomen was insufflated with dullness to percussion.  An 11 mm trocar was inserted.  Laparoscope was inserted.  The  above findings were noted.  A 5 mm trocar was inserted to the left of the midline 2 fingerbreadths above the pubic symphysis under direct visualization.  After careful and systematic evaluation of the abdomen and pelvis, Gyrus cutting forceps was used to ligate across the mesosalpinx bilaterally, uteroovarian ligaments down across the round ligament to the inferior portion of the broad ligaments bilaterally. The bladder was then elevated and scar tissue was dissected from the bladder and the pelvic sidewalls and the bladder was dissected off the anterior surface of the cervix.  Abdomen was desufflated.  Legs were repositioned.  A weighted speculum was placed in the vagina.  Posterior colpotomy was performed.  Cervix was circumscribed with Bovie cautery. LigaSure instrument was used to ligate across the uterosacral ligaments bilaterally, bladder pillars bilaterally, and cardinal ligaments bilaterally.  The anterior vaginal mucosa was dissected off the cervix and the anterior peritoneum entered sharply.  The inferior portion of the broad ligament was ligated with LigaSure instrument.  The uterus was removed.  The bleeding was noted from the retracted blood vessel along the left sidewall.  An Allis clamp was used to grasp and pull the pedicle into the visual field and LigaSure instrument was used to ligate this area.  0 Monocryl was used to ligate the uterosacral ligaments  bilaterally.  Posterior peritoneum was then closed in a pursestring fashion with 0 Monocryl suture.  The uterosacral ligaments were plicated in the midline and the posterior vaginal mucosa was then closed in a vertical fashion using figure-of-eight 0 Monocryl suture.  A Foley catheter was placed with return of clear yellow urine.  Abdomen was reinsufflated.  Nezhat suction irrigator was used.  Small bleeding was noted from the areas of dissection.  Bipolar cautery was used to cauterize these areas and the pedicles were  reexamined and appeared to have good hemostasis.  I was able to locate the ureters with good peristalsis noted bilaterally and they were noted to be away from the line of dissection and without any obvious injuries because of small bleeders on the back of the bladder.  Arista was inserted and good hemostasis was achieved with this.  Trocars were then removed.  The abdomen was desufflated.  The infraumbilical skin incision was closed with 0 Vicryl interrupted suture in the fascia, 3-0 Vicryl Rapide subcuticular suture, and the 5 mm site was closed with a 3-0 Vicryl Rapide subcuticular suture.  The incisions were injected with 0.25% Marcaine, total 10 mL used.  The patient was stable and transferred to the recovery room.  Sponge and instrument count was normal x3.  ESTIMATED BLOOD LOSS:  700 mL.  The patient received 2 g of cefotetan preoperatively and will be admitted overnight for observation.     Monia Sabal Corinna Capra, M.D.     DCL/MEDQ  D:  06/14/2017  T:  06/14/2017  Job:  [id number]

## 2017-06-14 NOTE — Plan of Care (Signed)
Problem: Activity: Goal: Risk for activity intolerance will decrease Outcome: Progressing Patient has been assisted to sit and stand at bedside, but experienced too much dizziness to try to ambulate. Pt reassured that she will be better able to tolerate activity when she is no longer receiving Dilaudid through an IV PCA. Plan to D/C PCA in early am so she will be able to get OOB after foley catheter is removed in AM.   Problem: Fluid Volume: Goal: Ability to maintain a balanced intake and output will improve Outcome: Progressing Tolerating some pos without nausea.   Problem: Nutrition: Goal: Adequate nutrition will be maintained Outcome: Progressing Will advance to regular diet in AM.   Problem: Education: Goal: Knowledge of the prescribed therapeutic regimen will improve Outcome: Progressing Reviewed and discussed plan of care with patient.

## 2017-06-14 NOTE — Brief Op Note (Signed)
06/14/2017  9:19 AM  PATIENT:  Kristin Church  41 y.o. female  PRE-OPERATIVE DIAGNOSIS:  AUB, ANEMIA, PELVIC PAIN, FIBROID  POST-OPERATIVE DIAGNOSIS:  AUB, ANEMIA, PELVIC PAIN, FIBROID  PROCEDURE:  Procedure(s): LAPAROSCOPIC ASSISTED VAGINAL HYSTERECTOMY WITH SALPINGECTOMY (Bilateral)  SURGEON:  Surgeon(s) and Role:    Louretta Shorten, MD - Primary    * Maisie Fus, MD - Assisting  PHYSICIAN ASSISTANT:   ASSISTANTSNori Riis    ANESTHESIA:   general  EBL:  700 mL   BLOOD ADMINISTERED:none  DRAINS: Urinary Catheter (Foley)   LOCAL MEDICATIONS USED:  MARCAINE     SPECIMEN:  Source of Specimen:  uterus and both tubes  DISPOSITION OF SPECIMEN:  PATHOLOGY  COUNTS:  YES  TOURNIQUET:  * No tourniquets in log *  DICTATION: .Other Dictation: Dictation Number 1  PLAN OF CARE: Admit for overnight observation  PATIENT DISPOSITION:  PACU - hemodynamically stable.   Delay start of Pharmacological VTE agent (>24hrs) due to surgical blood loss or risk of bleeding: no

## 2017-06-14 NOTE — Anesthesia Procedure Notes (Signed)
Procedure Name: Intubation Date/Time: 06/14/2017 7:33 AM Performed by: Jonna Munro Pre-anesthesia Checklist: Patient identified, Emergency Drugs available, Suction available and Patient being monitored Patient Re-evaluated:Patient Re-evaluated prior to induction Oxygen Delivery Method: Circle system utilized and Simple face mask Preoxygenation: Pre-oxygenation with 100% oxygen Induction Type: Combination inhalational/ intravenous induction Ventilation: Mask ventilation without difficulty Laryngoscope Size: Mac and 3 Grade View: Grade II Tube type: Oral Tube size: 7.0 mm Number of attempts: 1 Airway Equipment and Method: Stylet Placement Confirmation: ETT inserted through vocal cords under direct vision,  positive ETCO2 and breath sounds checked- equal and bilateral Secured at: 23 cm Tube secured with: Tape Dental Injury: Teeth and Oropharynx as per pre-operative assessment

## 2017-06-15 ENCOUNTER — Encounter (HOSPITAL_COMMUNITY): Payer: Self-pay | Admitting: Obstetrics and Gynecology

## 2017-06-15 DIAGNOSIS — D259 Leiomyoma of uterus, unspecified: Secondary | ICD-10-CM | POA: Diagnosis not present

## 2017-06-15 LAB — CBC
HCT: 27.8 % — ABNORMAL LOW (ref 36.0–46.0)
HEMOGLOBIN: 9.7 g/dL — AB (ref 12.0–15.0)
MCH: 31 pg (ref 26.0–34.0)
MCHC: 34.9 g/dL (ref 30.0–36.0)
MCV: 88.8 fL (ref 78.0–100.0)
Platelets: 175 10*3/uL (ref 150–400)
RBC: 3.13 MIL/uL — AB (ref 3.87–5.11)
RDW: 14 % (ref 11.5–15.5)
WBC: 8.1 10*3/uL (ref 4.0–10.5)

## 2017-06-15 NOTE — Plan of Care (Signed)
Discussed pain, pain management, communication of pain needs at beginning of shift. Pt. In agreement to state pain needs. Pt. Reminded in front of family the need to call for OOB needs. One hour later pt. Observed to be using bathroom without assist. Pt. Had not called for assist and admitted she had not. Pt. Stated "I don't like to do what I'm told" I explained her fall risk scoring and that it is imperative to call for all OOB needs.  With admin of percocet prior to bed. Pt. Reminded again to call for all OOB needs or communicate and allow staff to take her to the bathroom with safety rounds. She agreed to POC.

## 2017-06-15 NOTE — Progress Notes (Signed)
1 Day Post-Op Procedure(s) (LRB): LAPAROSCOPIC ASSISTED VAGINAL HYSTERECTOMY WITH SALPINGECTOMY (Bilateral)  Subjective: Patient reports incisional pain, tolerating PO, + flatus and no problems voiding.    Objective: I have reviewed patient's vital signs, intake and output, medications and labs.  General: alert, cooperative, appears stated age and mild distress GI: soft, non-tender; bowel sounds normal; no masses,  no organomegaly and incision: clean, dry and intact Vaginal Bleeding: minimal  Assessment: s/p Procedure(s): LAPAROSCOPIC ASSISTED VAGINAL HYSTERECTOMY WITH SALPINGECTOMY (Bilateral): stable and Had a fall today due to unsteadiness.  Needs assistance with walking to bathroom  Plan: Will continue observation  Recheck CBC in am Abd binder for incisional pain Symptomatic anemia Orthostatic BPs tonight Ambulation with assistance only  LOS: 1 day    Maureen Duesing C 06/15/2017, 5:54 PM

## 2017-06-15 NOTE — Plan of Care (Signed)
Problem: Bowel/Gastric: Goal: Gastrointestinal status for postoperative course will improve Outcome: Progressing Bowel sounds active, patient passing gas.   Problem: Physical Regulation: Goal: Postoperative complications will be avoided or minimized Outcome: Progressing SCDs reapplied and on.  No signs and symptoms of postoperative infection at this time.   Problem: Respiratory: Goal: Ability to maintain adequate ventilation will improve Outcome: Progressing Patient maintaining adequate ventilation on Room Air.   Problem: Urinary Elimination: Goal: Ability to reestablish a normal urinary elimination pattern will improve Outcome: Progressing Patient producing adequate urinary output.  Urinary catheter still in place due to inability to ambulate to and from restroom.  Discussed with patient the need for frequent ambulation to remove foley.

## 2017-06-15 NOTE — Progress Notes (Signed)
After discussion with patient. Plan is to remove the Dilaudid PCA from use after patient tolerates a snack. She was able to eat and keep down several saltine crackers, some peanut butter and apple slices. PCA removed. IV  fluids remain.

## 2017-06-15 NOTE — Progress Notes (Signed)
RN requested to room. Patient's husband stated she requested him to assist her to the restroom, he denied and requested an Therapist, sports.   This RN discussed with patient the importance of asking for staff assistance with ambulation.

## 2017-06-15 NOTE — Progress Notes (Signed)
1 Day Post-Op Procedure(s) (LRB): LAPAROSCOPIC ASSISTED VAGINAL HYSTERECTOMY WITH SALPINGECTOMY (Bilateral)  Subjective: Patient reports incisional pain, tolerating PO and + flatus.    Objective: I have reviewed patient's vital signs, intake and output, medications and labs.  General: alert, cooperative, appears stated age and moderate distress GI: soft, non-tender; bowel sounds normal; no masses,  no organomegaly and incision: clean, dry and intact Vaginal Bleeding: none  Assessment: s/p Procedure(s): LAPAROSCOPIC ASSISTED VAGINAL HYSTERECTOMY WITH SALPINGECTOMY (Bilateral): stable  Plan: Advance diet Encourage ambulation Advance to PO medication  Kristin Church is still having a moderate amout of pain but the meds make her loopy and hard to walk.  Encouraged foley out and will attempt ambulation this morning  LOS: 1 day    Kristin Church 06/15/2017, 9:10 AM

## 2017-06-15 NOTE — Progress Notes (Signed)
Fall discussed with Dr. Corinna Capra.  No new orders, will continue to monitor.

## 2017-06-15 NOTE — Progress Notes (Signed)
Patient called out emergency call bell.  RN entered the room to find patient had fallen.  Patient had been instructed to call for help with ambulation.  Pt assisted back to bed by RN and NT.  Vital signs assessed and stable.  No signs of bruising or injury. Incision sites assessed and clean/dry/intact.   Patient stated "I thought I could do it myself".   Patient instructed again not to ambulate without assistance.   Dr. Corinna Capra called with no answer.  Will follow up.

## 2017-06-16 DIAGNOSIS — D259 Leiomyoma of uterus, unspecified: Secondary | ICD-10-CM | POA: Diagnosis not present

## 2017-06-16 LAB — CBC
HCT: 25.9 % — ABNORMAL LOW (ref 36.0–46.0)
HEMOGLOBIN: 9.1 g/dL — AB (ref 12.0–15.0)
MCH: 31.2 pg (ref 26.0–34.0)
MCHC: 35.1 g/dL (ref 30.0–36.0)
MCV: 88.7 fL (ref 78.0–100.0)
PLATELETS: 151 10*3/uL (ref 150–400)
RBC: 2.92 MIL/uL — AB (ref 3.87–5.11)
RDW: 14 % (ref 11.5–15.5)
WBC: 7.5 10*3/uL (ref 4.0–10.5)

## 2017-06-16 MED ORDER — OXYCODONE-ACETAMINOPHEN 5-325 MG PO TABS: 1.0000 | ORAL_TABLET | ORAL | 0 refills | Status: AC | PRN

## 2017-06-16 MED ORDER — IBUPROFEN 600 MG PO TABS: 600.0000 mg | ORAL_TABLET | Freq: Four times a day (QID) | ORAL | 0 refills | Status: AC | PRN

## 2017-06-16 NOTE — Progress Notes (Signed)
Subjective: Patient reports incisional pain, tolerating PO, + flatus and no problems voiding.    Objective: I have reviewed patient's vital signs, intake and output, medications and labs.  General: alert, cooperative, appears stated age and no distress GI: soft, non-tender; bowel sounds normal; no masses,  no organomegaly and incision: clean, dry and intact Vaginal Bleeding: none   Assessment/Plan: Doing much better with abd binder and pain control Now able to ambulate without difficulty  LOS: 1 day    Nyala Kirchner C 06/16/2017, 8:46 AM

## 2017-06-16 NOTE — Progress Notes (Signed)
Pt ambulated out teaching complete  

## 2017-06-16 NOTE — Discharge Summary (Signed)
Physician Discharge Summary  Patient ID: Kristin Church MRN: [id number] DOB/AGE: Jan 29, 1976 41 y.o.  Admit date: 06/14/2017 Discharge date: 06/16/2017  Admission Diagnoses:AUB, Pelvic pain, Fibroids  Discharge Diagnoses: Same plus pelvic adhesions, anemia Active Problems:   S/P laparoscopic assisted vaginal hysterectomy (LAVH)   Discharged Condition: good  Hospital Course: Pt underwent a LAVH, Bil Sal which was uncomplicated and EBL [id number].  Her post op care was complicated by symptomatic anemia with post op Hgb 9.7.  She had quick return of bowel and bladder function but was unable walk or stand effectively and actually fell on post op d 1.  By post op day 2 hgb was 9.1 and was tolerating po meds, diet and was able to walk without assistance  Consults: none  Significant Diagnostic Studies: labs: Hgb 9.1  Treatments: surgery: LAVH, Bil Sal  Discharge Exam: Blood pressure 103/61, pulse 72, temperature 99.3 F (37.4 C), temperature source Oral, resp. rate 16, height 5\' 2"  (1.575 m), weight 117 lb (53.1 kg), last menstrual period 05/16/2017, SpO2 100 %, unknown if currently breastfeeding. General appearance: alert, cooperative, appears stated age and mild distress GI: soft, non-tender; bowel sounds normal; no masses,  no organomegaly Incision/Wound:CD&I  Disposition: 01-Home or Self Care  Discharge Instructions    Call MD for:  difficulty breathing, headache or visual disturbances    Complete by:  As directed    Call MD for:  persistant nausea and vomiting    Complete by:  As directed    Call MD for:  redness, tenderness, or signs of infection (pain, swelling, redness, odor or green/yellow discharge around incision site)    Complete by:  As directed    Call MD for:  severe uncontrolled pain    Complete by:  As directed    Call MD for:  temperature >100.4    Complete by:  As directed    Diet general    Complete by:  As directed    Driving Restrictions    Complete by:  As  directed    No driving for 2 weeks   Increase activity slowly    Complete by:  As directed    Lifting restrictions    Complete by:  As directed    No lifting anything greater than 10 pounds (if you have to ask, don't lift it)   Sexual Activity Restrictions    Complete by:  As directed    Nothing in the vagina for 6 weeks     Allergies as of 06/16/2017      Reactions   Adhesive [tape] Rash, Other (See Comments)   Steri-strips cause blisters and redness   Fructose Hives, Diarrhea, Nausea And Vomiting   Other Hives, Diarrhea, Nausea And Vomiting   ALL PRESERVATIVES!!      Medication List    TAKE these medications   ferrous sulfate 325 (65 FE) MG EC tablet Take 325 mg by mouth at bedtime.   FISH OIL PO Take 1 capsule by mouth daily.   ibuprofen 600 MG tablet Commonly known as:  ADVIL,MOTRIN Take 1 tablet (600 mg total) by mouth every 6 (six) hours as needed (mild pain). What changed:  Another medication with the same name was added. Make sure you understand how and when to take each.   ibuprofen 800 MG tablet Commonly known as:  ADVIL,MOTRIN Take 800 mg by mouth every 8 (eight) hours as needed for pain. What changed:  Another medication with the same name was added. Make sure you understand  how and when to take each.   ibuprofen 600 MG tablet Commonly known as:  ADVIL,MOTRIN Take 1 tablet (600 mg total) by mouth every 6 (six) hours as needed (mild pain). What changed:  You were already taking a medication with the same name, and this prescription was added. Make sure you understand how and when to take each.   oxyCODONE-acetaminophen 5-325 MG tablet Commonly known as:  PERCOCET/ROXICET Take 1-2 tablets by mouth every 4 (four) hours as needed for severe pain (moderate to severe pain (when tolerating fluids)). What changed:  when to take this  reasons to take this   prenatal multivitamin Tabs tablet Take 1 tablet by mouth daily.   sertraline 100 MG tablet Commonly  known as:  ZOLOFT Take 100 mg by mouth daily.        Signed: Meet [place] C 06/16/2017, 8:50 AM

## 2017-06-16 NOTE — Progress Notes (Signed)
Pt. Ending shift free of falls and compliant. VSS. Pain well controlled with PO meds. Pt. Encouraged to continue alternating percocet and ibuprofen. Will monitor.

## 2017-06-18 ENCOUNTER — Encounter (HOSPITAL_COMMUNITY): Payer: Self-pay | Admitting: *Deleted

## 2017-06-18 ENCOUNTER — Inpatient Hospital Stay (HOSPITAL_COMMUNITY)
Admission: AD | Admit: 2017-06-18 | Discharge: 2017-06-18 | Disposition: A | Discharge status: Home / Self Care | Payer: BLUE CROSS/BLUE SHIELD | Source: Ambulatory Visit | Attending: Obstetrics and Gynecology | Admitting: Obstetrics and Gynecology

## 2017-06-18 DIAGNOSIS — Z803 Family history of malignant neoplasm of breast: Secondary | ICD-10-CM | POA: Diagnosis not present

## 2017-06-18 DIAGNOSIS — M545 Low back pain: Secondary | ICD-10-CM | POA: Insufficient documentation

## 2017-06-18 DIAGNOSIS — Z90710 Acquired absence of both cervix and uterus: Secondary | ICD-10-CM | POA: Diagnosis not present

## 2017-06-18 DIAGNOSIS — I10 Essential (primary) hypertension: Secondary | ICD-10-CM | POA: Insufficient documentation

## 2017-06-18 DIAGNOSIS — Z79899 Other long term (current) drug therapy: Secondary | ICD-10-CM | POA: Diagnosis not present

## 2017-06-18 DIAGNOSIS — Z808 Family history of malignant neoplasm of other organs or systems: Secondary | ICD-10-CM | POA: Insufficient documentation

## 2017-06-18 DIAGNOSIS — G8918 Other acute postprocedural pain: Secondary | ICD-10-CM

## 2017-06-18 DIAGNOSIS — O8609 Infection of obstetric surgical wound, other surgical site: Secondary | ICD-10-CM | POA: Diagnosis not present

## 2017-06-18 DIAGNOSIS — Z79891 Long term (current) use of opiate analgesic: Secondary | ICD-10-CM | POA: Diagnosis not present

## 2017-06-18 DIAGNOSIS — Z8249 Family history of ischemic heart disease and other diseases of the circulatory system: Secondary | ICD-10-CM | POA: Diagnosis not present

## 2017-06-18 DIAGNOSIS — R3911 Hesitancy of micturition: Secondary | ICD-10-CM | POA: Diagnosis present

## 2017-06-18 DIAGNOSIS — Z9889 Other specified postprocedural states: Secondary | ICD-10-CM | POA: Diagnosis not present

## 2017-06-18 DIAGNOSIS — F329 Major depressive disorder, single episode, unspecified: Secondary | ICD-10-CM | POA: Diagnosis not present

## 2017-06-18 DIAGNOSIS — T8140XA Infection following a procedure, unspecified, initial encounter: Secondary | ICD-10-CM | POA: Diagnosis not present

## 2017-06-18 DIAGNOSIS — Z833 Family history of diabetes mellitus: Secondary | ICD-10-CM | POA: Insufficient documentation

## 2017-06-18 LAB — WET PREP, GENITAL
CLUE CELLS WET PREP: NONE SEEN
Sperm: NONE SEEN
TRICH WET PREP: NONE SEEN
Yeast Wet Prep HPF POC: NONE SEEN

## 2017-06-18 LAB — URINALYSIS, ROUTINE W REFLEX MICROSCOPIC
Bacteria, UA: NONE SEEN
Bilirubin Urine: NEGATIVE
GLUCOSE, UA: NEGATIVE mg/dL
KETONES UR: NEGATIVE mg/dL
NITRITE: NEGATIVE
PH: 7 (ref 5.0–8.0)
Protein, ur: 30 mg/dL — AB
Specific Gravity, Urine: 1.02 (ref 1.005–1.030)

## 2017-06-18 LAB — COMPREHENSIVE METABOLIC PANEL
ALK PHOS: 51 U/L (ref 38–126)
ALT: 11 U/L — AB (ref 14–54)
ANION GAP: 8 (ref 5–15)
AST: 13 U/L — ABNORMAL LOW (ref 15–41)
Albumin: 3.7 g/dL (ref 3.5–5.0)
BILIRUBIN TOTAL: 0.5 mg/dL (ref 0.3–1.2)
BUN: 5 mg/dL — ABNORMAL LOW (ref 6–20)
CALCIUM: 8.5 mg/dL — AB (ref 8.9–10.3)
CO2: 25 mmol/L (ref 22–32)
CREATININE: 0.62 mg/dL (ref 0.44–1.00)
Chloride: 103 mmol/L (ref 101–111)
Glucose, Bld: 91 mg/dL (ref 65–99)
Potassium: 3.9 mmol/L (ref 3.5–5.1)
Sodium: 136 mmol/L (ref 135–145)
TOTAL PROTEIN: 6.9 g/dL (ref 6.5–8.1)

## 2017-06-18 LAB — CBC WITH DIFFERENTIAL/PLATELET
Basophils Absolute: 0.1 10*3/uL (ref 0.0–0.1)
Basophils Relative: 1 %
EOS ABS: 0.2 10*3/uL (ref 0.0–0.7)
Eosinophils Relative: 2 %
HEMATOCRIT: 29.8 % — AB (ref 36.0–46.0)
HEMOGLOBIN: 10.2 g/dL — AB (ref 12.0–15.0)
LYMPHS ABS: 1.4 10*3/uL (ref 0.7–4.0)
LYMPHS PCT: 16 %
MCH: 29.9 pg (ref 26.0–34.0)
MCHC: 34.2 g/dL (ref 30.0–36.0)
MCV: 87.4 fL (ref 78.0–100.0)
MONOS PCT: 6 %
Monocytes Absolute: 0.5 10*3/uL (ref 0.1–1.0)
NEUTROS ABS: 7 10*3/uL (ref 1.7–7.7)
Neutrophils Relative %: 77 %
Platelets: 225 10*3/uL (ref 150–400)
RBC: 3.41 MIL/uL — AB (ref 3.87–5.11)
RDW: 13.8 % (ref 11.5–15.5)
WBC: 9.2 10*3/uL (ref 4.0–10.5)

## 2017-06-18 MED ORDER — AMOXICILLIN-POT CLAVULANATE 875-125 MG PO TABS
1.0000 | ORAL_TABLET | Freq: Two times a day (BID) | ORAL | 0 refills | Status: AC
Start: 2017-06-18 — End: 2017-06-28

## 2017-06-18 MED ORDER — POLYETHYLENE GLYCOL 3350 17 GM/SCOOP PO POWD: 17.0000 g | Freq: Every day | ORAL | 0 refills | Status: AC

## 2017-06-18 MED ORDER — KETOROLAC TROMETHAMINE 30 MG/ML IJ SOLN
30.0000 mg | Freq: Once | INTRAMUSCULAR | Status: AC
  Administered 2017-06-18: 30 mg via INTRAMUSCULAR
  Filled 2017-06-18: qty 1

## 2017-06-18 NOTE — Discharge Instructions (Signed)
--  Take antibiotics as prescribed --Follow up with Dr. Corinna Capra tomorrow

## 2017-06-18 NOTE — MAU Note (Signed)
Vag hysterectomy on 10/31, started having trouble voiding yesterday afternoon, had severe pain with a BM this morning.  When she tries to void, only has a trickle.  Denies bleeding.

## 2017-06-18 NOTE — Progress Notes (Addendum)
Nonpregnant pt presents to triage for high temperature. Triage was 100.2. Currently 98.5 oral. Recently discharged from the hospital from undergoing a partial hysterectomy on 10/31. Denies bleeding.   Pt states had fever off and on since yesterday after. Been taking percocet and Motrin @ 6720-9470. Did not take tylenol.   1055: Temp 98.5         O2 Sat 100% RA  Pulse 77  1135: wetprep collected by another nurse.   1140: received ordered for IM toradol.   1147: toradol administered   1240: VSS see flow sheet for details. States toradol helped with pain. Pain is now 6/10 from pain level 8-9/10. Pt states 6 is tolerable for her. Able to rest.   1244: Provider made aware of the resulted labs.   1309: Provider at bs reassessing and discharge instructions given.   1312: pt left unit via ambulatory

## 2017-06-18 NOTE — MAU Provider Note (Signed)
History     CSN: [id number]  Arrival date and time: 06/18/17 6195   First Provider Initiated Contact with Patient 06/18/17 1105      Chief Complaint  Patient presents with  . trouble voiding  . pain with BM    HPI: Kristin Church is a 41 y.o. [id number] s/p histerectomy on 06/14/17, who presents to maternity admissions reporting difficulty urinating, and pain with having bowel movement. She reports she had had urinary hesitancy, and has been unable to fully empty her bladder. Also reports suprapubic pain and lower back pain, worse on the right side. Reports she has had only small BMs, and this morning she had a small BMs which was very painful. Endorses fever at home, and reports that since yesterday, temperature has been up to 101F. Taking motrin and Percocet at home, last dose was around 5am.   Also denies any abnormal vaginal discharge, dysuria, hematuria, nausea, vomiting, other GI or GU symptoms or other general symptoms.   Past obstetric history: OB History  Gravida Para Term Preterm AB Living  2 2 2     2   SAB TAB Ectopic Multiple Live Births        0 2    # Outcome Date GA Lbr Len/2nd Weight Sex Delivery Anes PTL Lv  2 Term 03/25/16 [redacted]w[redacted]d  7 lb 6 oz (3.345 kg) F CS-LTranv Spinal  LIV  1 Term 07/22/12 [redacted]w[redacted]d  7 lb 9 oz (3.43 kg) F CS-LTranv Spinal  LIV      Past Medical History:  Diagnosis Date  . AMA (advanced maternal age) multigravida 70+   . Depression   . Family history of adverse reaction to anesthesia    mother has nausea  . Headache    Migraines  . Heart murmur    childhood  . Hx of varicella   . Hypertension    recently noticed increase, no medicatons at this time  . Palpitations    Of questionable significance  . Pre-syncope    Probably multifactorial and long standing in duration  . Umbilical hernia     Past Surgical History:  Procedure Laterality Date  . ABLATION    . DILATION AND CURETTAGE OF UTERUS    . HERNIA REPAIR  12/2016  . WISDOM  TOOTH EXTRACTION      Family History  Problem Relation Age of Onset  . Hypertension Mother   . Mitral valve prolapse Mother   . Arthritis Mother   . Rheum arthritis Mother   . Hypertension Father   . Cancer Maternal Grandmother        breast  . Cancer Maternal Grandfather        colon  . Heart disease Paternal Grandfather   . Diabetes Paternal Grandfather     Social History   Tobacco Use  . Smoking status: Never Smoker  . Smokeless tobacco: Never Used  Substance Use Topics  . Alcohol use: Yes  . Drug use: No    Allergies:  Allergies  Allergen Reactions  . Adhesive [Tape] Rash and Other (See Comments)    Steri-strips cause blisters and redness  . Fructose Hives, Diarrhea and Nausea And Vomiting  . Other Hives, Diarrhea and Nausea And Vomiting    ALL PRESERVATIVES!!    Medications Prior to Admission  Medication Sig Dispense Refill Last Dose  . ferrous sulfate 325 (65 FE) MG EC tablet Take 325 mg by mouth at bedtime.   06/13/2017 at Unknown time  . ibuprofen (ADVIL,MOTRIN) 600  MG tablet Take 1 tablet (600 mg total) by mouth every 6 (six) hours as needed (mild pain). 30 tablet 0 Past Week at Unknown time  . ibuprofen (ADVIL,MOTRIN) 600 MG tablet Take 1 tablet (600 mg total) by mouth every 6 (six) hours as needed (mild pain). 30 tablet 0   . ibuprofen (ADVIL,MOTRIN) 800 MG tablet Take 800 mg by mouth every 8 (eight) hours as needed for pain.   ON HOLD at [place]  . Omega-3 Fatty Acids (FISH OIL PO) Take 1 capsule by mouth daily.    06/13/2017 at Unknown time  . oxyCODONE-acetaminophen (PERCOCET/ROXICET) 5-325 MG tablet Take 1-2 tablets by mouth every 4 (four) hours as needed for severe pain (moderate to severe pain (when tolerating fluids)). 30 tablet 0   . Prenatal Vit-Fe Fumarate-FA (PRENATAL MULTIVITAMIN) TABS Take 1 tablet by mouth daily.   06/13/2017 at Unknown time  . sertraline (ZOLOFT) 100 MG tablet Take 100 mg by mouth daily.   06/13/2017 at Unknown time     Review of Systems - Negative except for what is mentioned in HPI.  Physical Exam   Blood pressure 115/73, pulse 81, temperature 100.1 F (37.8 C), temperature source Oral, resp. rate 18, last menstrual period 05/16/2017, unknown if currently breastfeeding.  Constitutional: Well-developed, well-nourished female in no acute distress.  HENT: [place]/AT, normal oropharynx mucosa. MMM Eyes: normal conjunctivae, no scleral icterus Cardiovascular: normal rate, regular rhythm Respiratory: normal effort, lungs CTAB.  GI: Abd soft, non-distended. Diffuse TTP across lower abdomen, worse on the right; no rebound tenderness or guarding.  Pelvic: NEFG, small amount of clear fluid in vaginal vault; vaginal cuff with stiches in place with small amount of purulent drainage around them visualized on speculum exam. MSK: Extremities nontender, no edema, normal ROM Neurologic: Alert and oriented x 4. Psych: Normal mood and affect Skin: warm and dry  MAU Course/MDM  Procedures  Pt seen and examined as above. VS within normal limits. Temp is 100.54F here. 1054F at home per patient report. She was able to void here.  Toradol IM given for pain. Labs ordered, and reviewed: Results for orders placed or performed during the hospital encounter of 06/18/17  Wet prep, genital  Result Value Ref Range   Yeast Wet Prep HPF POC NONE SEEN NONE SEEN   Trich, Wet Prep NONE SEEN NONE SEEN   Clue Cells Wet Prep HPF POC NONE SEEN NONE SEEN   WBC, Wet Prep HPF POC FEW (A) NONE SEEN   Sperm NONE SEEN   Urinalysis, Routine w reflex microscopic  Result Value Ref Range   Color, Urine YELLOW YELLOW   APPearance HAZY (A) CLEAR   Specific Gravity, Urine 1.020 1.005 - 1.030   pH 7.0 5.0 - 8.0   Glucose, UA NEGATIVE NEGATIVE mg/dL   Hgb urine dipstick SMALL (A) NEGATIVE   Bilirubin Urine NEGATIVE NEGATIVE   Ketones, ur NEGATIVE NEGATIVE mg/dL   Protein, ur 30 (A) NEGATIVE mg/dL   Nitrite NEGATIVE NEGATIVE   Leukocytes, UA  SMALL (A) NEGATIVE   RBC / HPF 6-30 0 - 5 RBC/hpf   WBC, UA 6-30 0 - 5 WBC/hpf   Bacteria, UA NONE SEEN NONE SEEN   Squamous Epithelial / LPF 6-30 (A) NONE SEEN   Mucus PRESENT   CBC with Differential/Platelet  Result Value Ref Range   WBC 9.2 4.0 - 10.5 K/uL   RBC 3.41 (L) 3.87 - 5.11 MIL/uL   Hemoglobin 10.2 (L) 12.0 - 15.0 g/dL   HCT 29.8 (  L) 36.0 - 46.0 %   MCV 87.4 78.0 - 100.0 fL   MCH 29.9 26.0 - 34.0 pg   MCHC 34.2 30.0 - 36.0 g/dL   RDW 13.8 11.5 - 15.5 %   Platelets 225 150 - 400 K/uL   Neutrophils Relative % 77 %   Neutro Abs 7.0 1.7 - 7.7 K/uL   Lymphocytes Relative 16 %   Lymphs Abs 1.4 0.7 - 4.0 K/uL   Monocytes Relative 6 %   Monocytes Absolute 0.5 0.1 - 1.0 K/uL   Eosinophils Relative 2 %   Eosinophils Absolute 0.2 0.0 - 0.7 K/uL   Basophils Relative 1 %   Basophils Absolute 0.1 0.0 - 0.1 K/uL  Comprehensive metabolic panel  Result Value Ref Range   Sodium 136 135 - 145 mmol/L   Potassium 3.9 3.5 - 5.1 mmol/L   Chloride 103 101 - 111 mmol/L   CO2 25 22 - 32 mmol/L   Glucose, Bld 91 65 - 99 mg/dL   BUN 5 (L) 6 - 20 mg/dL   Creatinine, Ser 0.62 0.44 - 1.00 mg/dL   Calcium 8.5 (L) 8.9 - 10.3 mg/dL   Total Protein 6.9 6.5 - 8.1 g/dL   Albumin 3.7 3.5 - 5.0 g/dL   AST 13 (L) 15 - 41 U/L   ALT 11 (L) 14 - 54 U/L   Alkaline Phosphatase 51 38 - 126 U/L   Total Bilirubin 0.5 0.3 - 1.2 mg/dL   GFR calc non Af Amer >60 >60 mL/min   GFR calc Af Amer >60 >60 mL/min   Anion gap 8 5 - 15   No leukocytosis on labs. CBC, CMP reassuring. Urine sent for culture  Dicussed presentation and findings with Dr. Julien Girt. Will d/c home on Augmentin, and follow up with Dr. Corinna Capra tomorrow.    Assessment and Plan  Assessment: 1. Postoperative infection   2. Postoperative pain     Plan: --Rx: Augmentin 875-125 ng BID --Advised to continue stool softener at home, and add Miralax daily to help with BMs. --Follow up with Dr. Corinna Capra tomorrow --Discussed return  precautions --Discharge home in stable condition.    Degele, Jenne Pane, MD 06/18/2017 11:06 AM

## 2017-06-20 LAB — URINE CULTURE

## 2017-07-13 ENCOUNTER — Other Ambulatory Visit: Payer: Self-pay | Admitting: Obstetrics and Gynecology

## 2017-07-13 DIAGNOSIS — Z803 Family history of malignant neoplasm of breast: Secondary | ICD-10-CM

## 2017-07-28 ENCOUNTER — Ambulatory Visit
Admission: RE | Admit: 2017-07-28 | Discharge: 2017-07-28 | Disposition: A | Discharge status: Home / Self Care | Payer: BLUE CROSS/BLUE SHIELD | Source: Ambulatory Visit | Attending: Obstetrics and Gynecology | Admitting: Obstetrics and Gynecology

## 2017-07-28 DIAGNOSIS — Z803 Family history of malignant neoplasm of breast: Secondary | ICD-10-CM

## 2017-07-28 MED ORDER — GADOBENATE DIMEGLUMINE 529 MG/ML IV SOLN
10.0000 mL | Freq: Once | INTRAVENOUS | Status: AC | PRN
  Administered 2017-07-28: 10 mL via INTRAVENOUS

## 2018-05-03 IMAGING — US US PELVIS COMPLETE
1 series · 14 of 25 positions shown · non-contrast
Comparison: None

CLINICAL DATA: 41-year-old female status post D&C with
endometrial ablation on 05/11/2017, presenting with pelvic pain and
fever. LMP 04/17/2017. History of Cesarean section and bilateral
tubal ligation.

EXAM:
TRANSABDOMINAL AND TRANSVAGINAL ULTRASOUND OF PELVIS
TECHNIQUE: Both transabdominal and transvaginal ultrasound examinations of the
pelvis were performed. Transabdominal technique was performed for
global imaging of the pelvis including uterus, ovaries, adnexal
regions, and pelvic cul-de-sac. It was necessary to proceed with
endovaginal exam following the transabdominal exam to visualize the
endometrium and adnexa.

[Series 1: us pelvis complete · 14 of 77 slices shown]
[im 1/77]
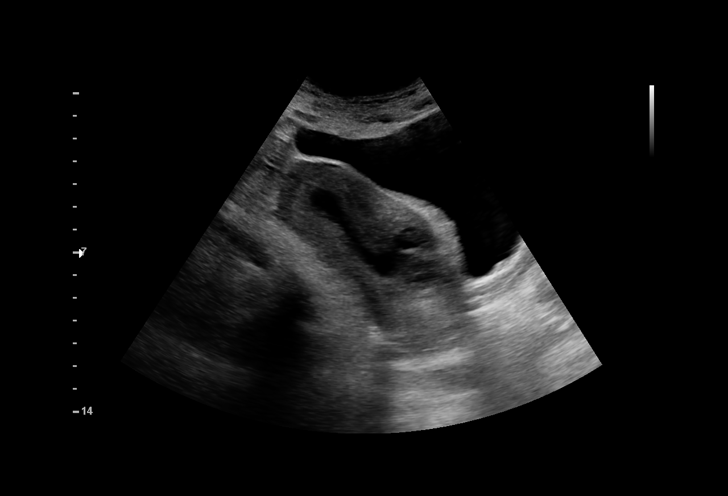
[im 7/77]
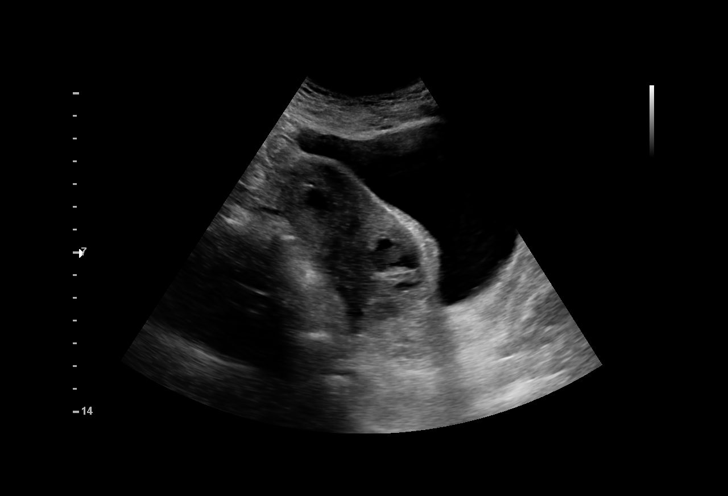
[im 13/77]
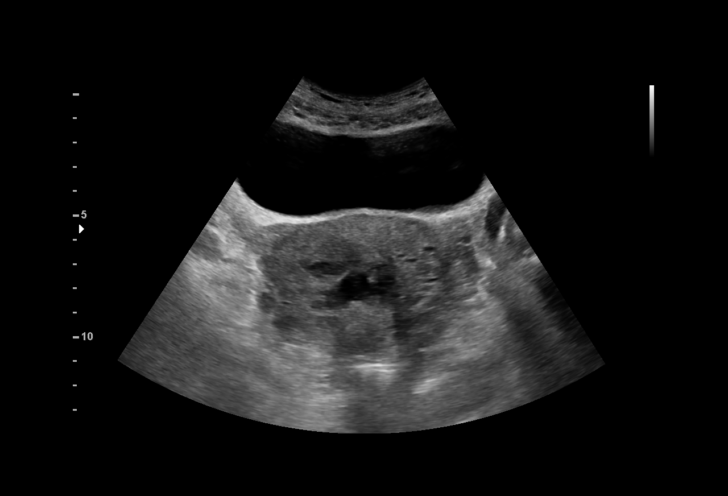
[im 20/77]
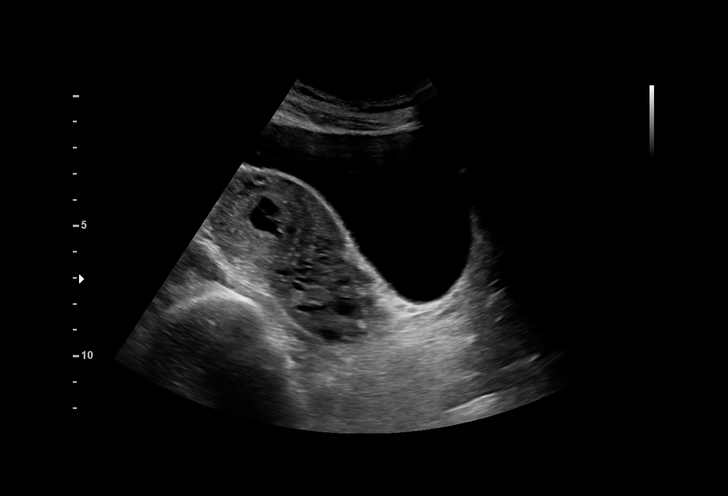
[im 26/77]
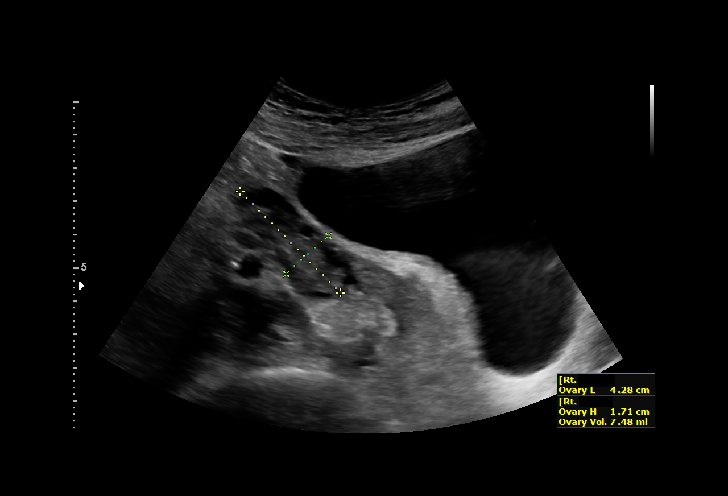
[im 29/77]
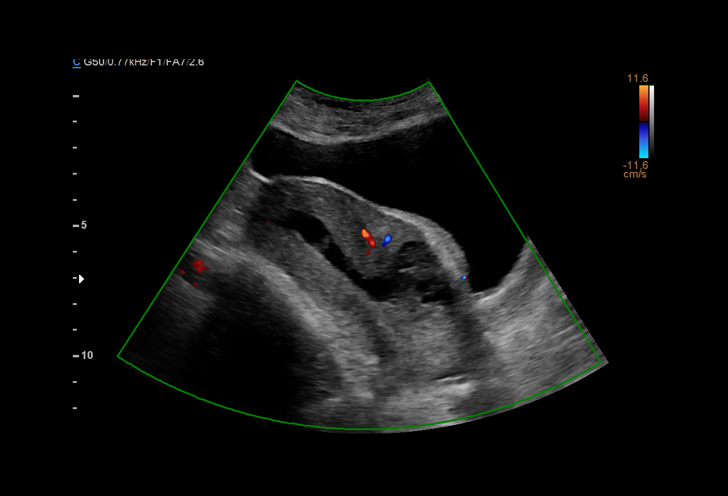
[im 35/77]
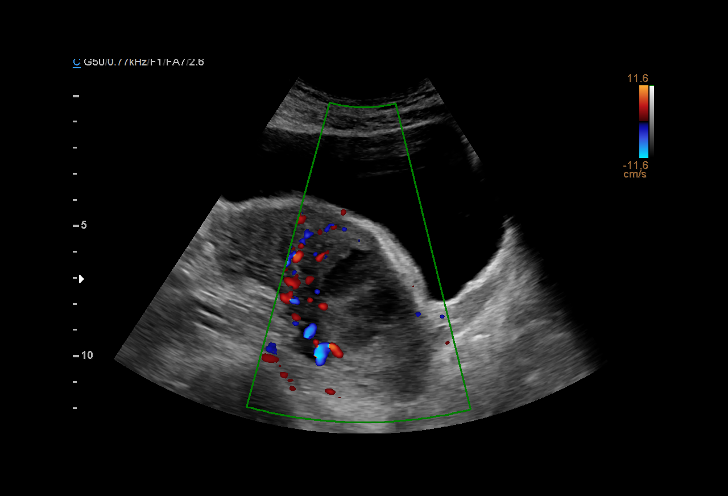
[im 42/77]
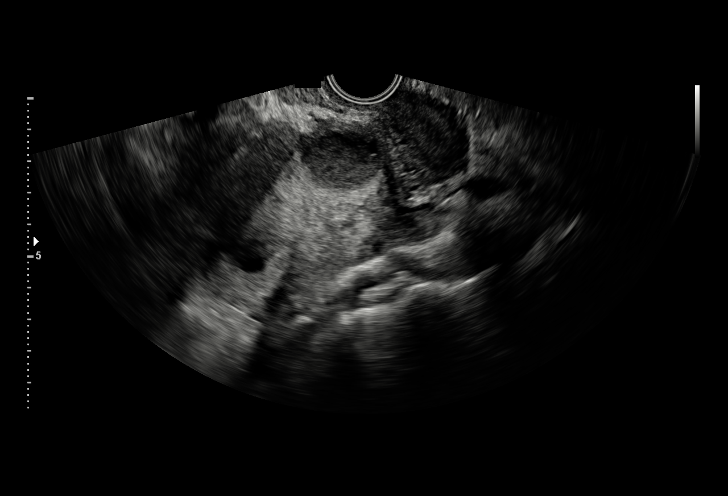
[im 48/77]
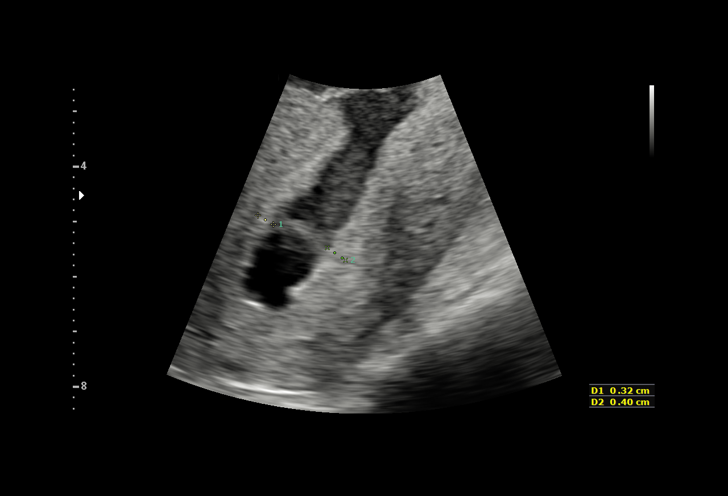
[im 51/77]
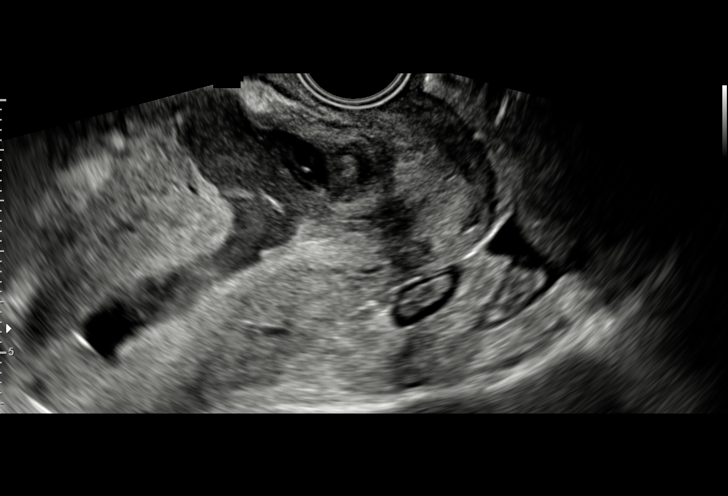
[im 58/77]
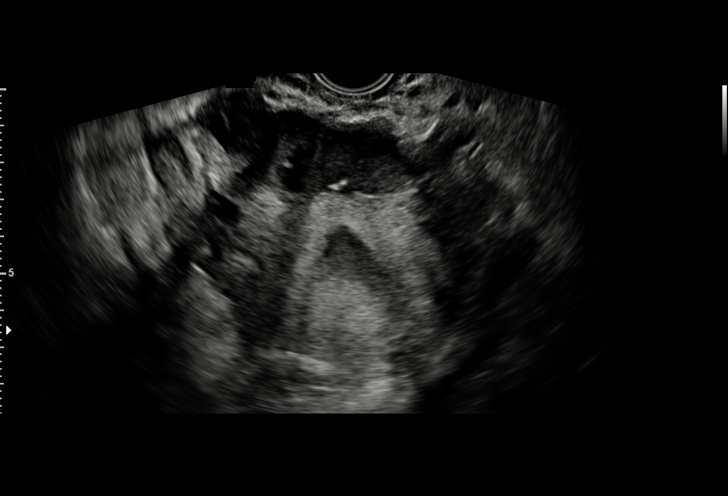
[im 64/77]
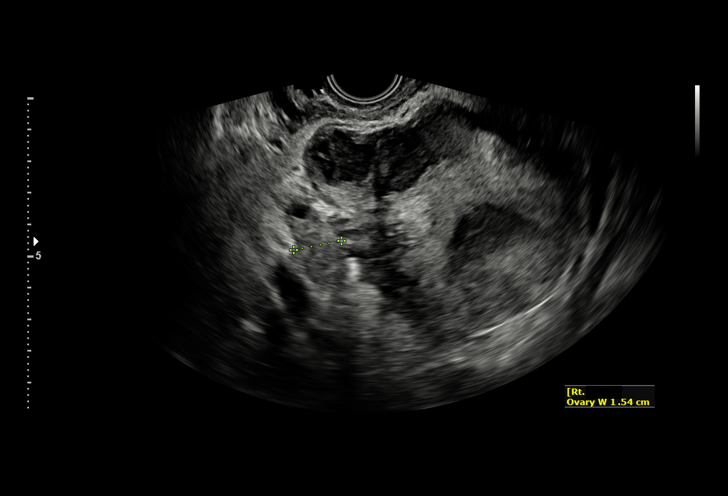
[im 70/77]
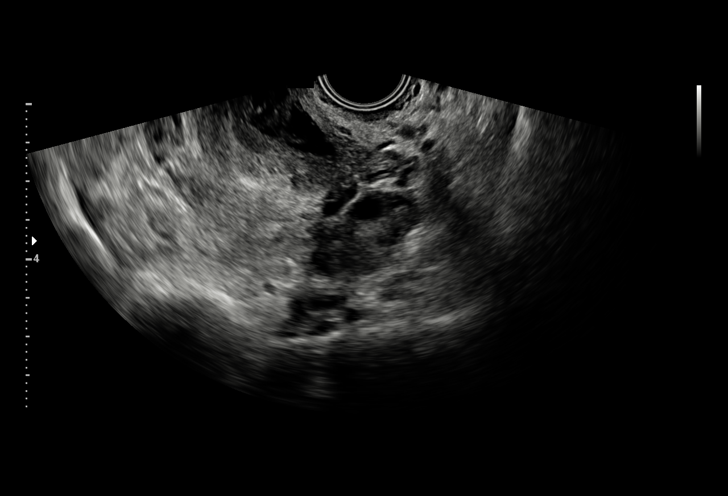
[im 77/77]
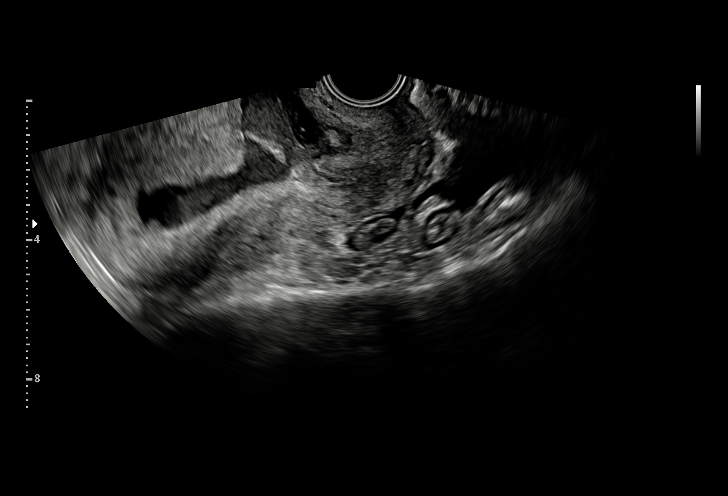

[14 of 25 positions shown; findings below may reference images not displayed]

FINDINGS: Uterus

Measurements: 10.6 x 4.7 x 7.3 cm. There is a complex intramural
fluid collection measuring 4.4 x 2.3 x 5.7 cm in the anterior lower
uterine segment, which contains heterogeneous internal echoes and no
definite internal vascularity on color Doppler. There is possible
discontinuity of the overlying myometrium in the anterior lower
uterine segment at the site of the complex fluid collection. No
uterine fibroids.

Endometrium

Thickness: 7 mm. There is mild distention of the endometrial cavity
by heterogeneous mobile material, which freely communicates with the
complex anterior lower uterine segment intramural fluid collection,
as seen on the provided cine sequence.

Right ovary

Measurements: 3.3 x 3.3 x 1.5 cm. Normal appearance/no adnexal mass.

Left ovary

Measurements: 3.2 x 2.0 x 1.8 cm. Normal appearance/no adnexal mass.

Other findings

Small volume free fluid in the pelvic cul-de-sac.
IMPRESSION: 1. Complex intramural fluid collection measuring 4.4 x 2.3 x 5.7 cm
in the anterior lower uterine segment, which freely communicates
with heterogeneous material mildly distending the endometrial
cavity. This collection is presumably at the site of a Cesarean
scar. Possible discontinuity of the overlying myometrium in the
anterior lower uterine segment at the site of the complex fluid
collection, cannot exclude focal uterine perforation.
2. Small volume free fluid in the pelvic cul-de-sac.

These results were called by telephone at the time of interpretation
on 05/16/2017 at [DATE] to Dr. RUCEL EDREES , who verbally
acknowledged these results.

## 2019-02-11 ENCOUNTER — Other Ambulatory Visit: Payer: Self-pay | Admitting: Obstetrics and Gynecology

## 2019-02-11 DIAGNOSIS — Z803 Family history of malignant neoplasm of breast: Secondary | ICD-10-CM

## 2019-05-31 ENCOUNTER — Other Ambulatory Visit: Payer: Self-pay | Admitting: Obstetrics and Gynecology

## 2019-05-31 DIAGNOSIS — N644 Mastodynia: Secondary | ICD-10-CM

## 2019-06-05 ENCOUNTER — Other Ambulatory Visit: Payer: Self-pay

## 2019-06-05 ENCOUNTER — Ambulatory Visit
Admission: RE | Admit: 2019-06-05 | Discharge: 2019-06-05 | Disposition: A | Discharge status: Home / Self Care | Payer: Self-pay | Source: Ambulatory Visit | Attending: Obstetrics and Gynecology | Admitting: Obstetrics and Gynecology

## 2019-06-05 DIAGNOSIS — N644 Mastodynia: Secondary | ICD-10-CM

## 2020-08-15 HISTORY — PX: HIP SURGERY: SHX245

## 2020-10-28 ENCOUNTER — Emergency Department (HOSPITAL_BASED_OUTPATIENT_CLINIC_OR_DEPARTMENT_OTHER): Payer: BC Managed Care – PPO

## 2020-10-28 ENCOUNTER — Encounter (HOSPITAL_BASED_OUTPATIENT_CLINIC_OR_DEPARTMENT_OTHER): Payer: Self-pay

## 2020-10-28 ENCOUNTER — Emergency Department (HOSPITAL_BASED_OUTPATIENT_CLINIC_OR_DEPARTMENT_OTHER)
Admission: EM | Admit: 2020-10-28 | Discharge: 2020-10-28 | Disposition: A | Discharge status: Home / Self Care | Payer: BC Managed Care – PPO | Attending: Emergency Medicine | Admitting: Emergency Medicine

## 2020-10-28 ENCOUNTER — Other Ambulatory Visit: Payer: Self-pay

## 2020-10-28 DIAGNOSIS — M25552 Pain in left hip: Secondary | ICD-10-CM | POA: Diagnosis not present

## 2020-10-28 DIAGNOSIS — W108XXA Fall (on) (from) other stairs and steps, initial encounter: Secondary | ICD-10-CM | POA: Diagnosis not present

## 2020-10-28 DIAGNOSIS — S199XXA Unspecified injury of neck, initial encounter: Secondary | ICD-10-CM | POA: Insufficient documentation

## 2020-10-28 DIAGNOSIS — S0990XA Unspecified injury of head, initial encounter: Secondary | ICD-10-CM | POA: Insufficient documentation

## 2020-10-28 DIAGNOSIS — W19XXXA Unspecified fall, initial encounter: Secondary | ICD-10-CM

## 2020-10-28 DIAGNOSIS — S4992XA Unspecified injury of left shoulder and upper arm, initial encounter: Secondary | ICD-10-CM | POA: Diagnosis not present

## 2020-10-28 DIAGNOSIS — I10 Essential (primary) hypertension: Secondary | ICD-10-CM | POA: Insufficient documentation

## 2020-10-28 MED ORDER — KETOROLAC TROMETHAMINE 60 MG/2ML IM SOLN
15.0000 mg | Freq: Once | INTRAMUSCULAR | Status: AC
  Administered 2020-10-28: 15 mg via INTRAMUSCULAR
  Filled 2020-10-28: qty 2

## 2020-10-28 MED ORDER — METHOCARBAMOL 750 MG PO TABS: 750.0000 mg | ORAL_TABLET | Freq: Two times a day (BID) | ORAL | 0 refills | Status: AC | PRN

## 2020-10-28 MED ORDER — METHOCARBAMOL 500 MG PO TABS
750.0000 mg | ORAL_TABLET | Freq: Once | ORAL | Status: AC
  Administered 2020-10-28: 750 mg via ORAL
  Filled 2020-10-28: qty 2

## 2020-10-28 MED ORDER — DICLOFENAC SODIUM 1 % EX GEL: 4.0000 g | Freq: Four times a day (QID) | CUTANEOUS | 2 refills | Status: DC

## 2020-10-28 NOTE — ED Notes (Addendum)
Multiple CT and Xray ordered , VS will not be obtained q 30 minutes for the meantime .

## 2020-10-28 NOTE — ED Triage Notes (Addendum)
Pt fell head first down 15 stairs, states head, neck and left shoulder hit rail post at the bottom. C/o neck pain, shoulder & headache. Does not think she lost consciousness after fall but did become dizzy. C/o blurred vision.  C-collar placed during triage

## 2020-10-28 NOTE — ED Provider Notes (Signed)
[facility] EMERGENCY DEPARTMENT Provider Note   CSN: [id number] Arrival date & time: 10/28/20  0950     History Chief Complaint  Patient presents with  . Fall    Kristin Church is a 45 y.o. female.  HPI      Kristin Church is a 45 y.o. female, with a history of depression, presenting to the ED accompanied by her husband for evaluation following a fall that occurred around 8:30 AM this morning. Patient states she was distracted, walking in heels, slipped at the top of her internal wooden staircase and fell.  Part of the fall was through the air, but landed at the bottom with her left shoulder and left side of the neck hitting the banister. She notes feeling stunned with some blurry vision right afterward, but these symptoms have resolved. She complains of moderate to severe left neck pain, left shoulder pain, and acute on chronic left hip pain.  She denies LOC, nausea/vomiting, numbness, weakness, vision loss, chest pain, shortness of breath, abdominal pain, loss of bowel or bladder function, or any other complaints.    Past Medical History:  Diagnosis Date  . AMA (advanced maternal age) multigravida 93+   . Depression   . Family history of adverse reaction to anesthesia    mother has nausea  . Headache    Migraines  . Heart murmur    childhood  . Hx of varicella   . Hypertension    recently noticed increase, no medicatons at this time  . Palpitations    Of questionable significance  . Pre-syncope    Probably multifactorial and long standing in duration  . Umbilical hernia     Patient Active Problem List   Diagnosis Date Noted  . S/P laparoscopic assisted vaginal hysterectomy (LAVH) 06/14/2017  . Syncope, vasovagal 06/13/2017  . Post op infection 05/16/2017  . Cesarean delivery delivered 03/25/2016    Past Surgical History:  Procedure Laterality Date  . ABLATION    . BREAST BIOPSY Right 10/14/2011  . CESAREAN SECTION  07/22/2012   Procedure:  CESAREAN SECTION;  Surgeon: Luz Lex, MD;  Location: [facility];  Service: Obstetrics;  Laterality: N/A;  . CESAREAN SECTION WITH BILATERAL TUBAL LIGATION Bilateral 03/25/2016   Procedure: REPEAT CESAREAN SECTION WITH BILATERAL TUBAL LIGATION;  Surgeon: Louretta Shorten, MD;  Location: [facility];  Service: Obstetrics;  Laterality: Bilateral;  REPEAT EDC 03/28/16 ALLERGIC TO STERI STRIPS Clarrisa K to RNFA  . DILATION AND CURETTAGE OF UTERUS    . HERNIA REPAIR  12/2016  . LAPAROSCOPIC VAGINAL HYSTERECTOMY WITH SALPINGECTOMY Bilateral 06/14/2017   Procedure: LAPAROSCOPIC ASSISTED VAGINAL HYSTERECTOMY WITH SALPINGECTOMY;  Surgeon: Louretta Shorten, MD;  Location: [facility];  Service: Gynecology;  Laterality: Bilateral;  . WISDOM TOOTH EXTRACTION       OB History    Gravida  2   Para  2   Term  2   Preterm      AB      Living  2     SAB      IAB      Ectopic      Multiple  0   Live Births  2           Family History  Problem Relation Age of Onset  . Hypertension Mother   . Mitral valve prolapse Mother   . Arthritis Mother   . Rheum arthritis Mother   . Breast cancer Mother 56  . Hypertension Father   .  Cancer Maternal Grandmother        breast  . Breast cancer Maternal Grandmother 25  . Cancer Maternal Grandfather        colon  . Heart disease Paternal Grandfather   . Diabetes Paternal Grandfather     Social History   Tobacco Use  . Smoking status: Never Smoker  . Smokeless tobacco: Never Used  Substance Use Topics  . Alcohol use: Not Currently  . Drug use: No    Home Medications Prior to Admission medications   Medication Sig Start Date End Date Taking? Authorizing Provider  diclofenac Sodium (VOLTAREN) 1 % GEL Apply 4 g topically 4 (four) times daily. 10/28/20  Yes Tammey Deeg C, PA-C  methocarbamol (ROBAXIN) 750 MG tablet Take 1 tablet (750 mg total) by mouth 2 (two) times daily as needed for muscle spasms (or muscle tightness). 10/28/20  Yes Kitti Mcclish C,  PA-C  ferrous sulfate 325 (65 FE) MG EC tablet Take 325 mg by mouth at bedtime.    [provider]  ibuprofen (ADVIL,MOTRIN) 600 MG tablet Take 1 tablet (600 mg total) by mouth every 6 (six) hours as needed (mild pain). Patient not taking: Reported on 06/18/2017 05/18/17   Louretta Shorten, MD  ibuprofen (ADVIL,MOTRIN) 600 MG tablet Take 1 tablet (600 mg total) by mouth every 6 (six) hours as needed (mild pain). 06/16/17   Louretta Shorten, MD  Omega-3 Fatty Acids (FISH OIL PO) Take 1 capsule by mouth daily.     [provider]  oxyCODONE-acetaminophen (PERCOCET/ROXICET) 5-325 MG tablet Take 1-2 tablets by mouth every 4 (four) hours as needed for severe pain (moderate to severe pain (when tolerating fluids)). 06/16/17   Louretta Shorten, MD  polyethylene glycol powder (MIRALAX) powder Take 17 g daily by mouth. 06/18/17   Degele, Jenne Pane, MD  Prenatal Vit-Fe Fumarate-FA (PRENATAL MULTIVITAMIN) TABS Take 1 tablet by mouth daily.    [provider]  sertraline (ZOLOFT) 100 MG tablet Take 100 mg by mouth daily.    [provider]    Allergies    Adhesive [tape], Fructose, and Other  Review of Systems   Review of Systems  Constitutional: Negative for diaphoresis.  Eyes: Negative for visual disturbance.  Respiratory: Negative for shortness of breath.   Cardiovascular: Negative for chest pain.  Gastrointestinal: Negative for abdominal pain, nausea and vomiting.  Musculoskeletal: Positive for arthralgias and neck pain.  Neurological: Negative for dizziness, syncope, weakness, numbness and headaches.  All other systems reviewed and are negative.   Physical Exam Updated Vital Signs BP (!) 142/82 (BP Location: Right Arm)   Pulse 68   Resp 18   Ht 5\' 2"  (1.575 m)   Wt 63 kg   LMP 05/16/2017   SpO2 100%   BMI 25.42 kg/m   Physical Exam Vitals and nursing note reviewed.  Constitutional:      General: She is not in acute distress.    Appearance: She is well-developed. She  is not diaphoretic.     Interventions: Cervical collar in place.  HENT:     Head: Normocephalic and atraumatic.     Right Ear: Tympanic membrane and ear canal normal.     Left Ear: Tympanic membrane and ear canal normal.     Nose: Nose normal.     Mouth/Throat:     Mouth: Mucous membranes are moist.     Pharynx: Oropharynx is clear.  Eyes:     Conjunctiva/sclera: Conjunctivae normal.  Cardiovascular:     Rate  and Rhythm: Normal rate and regular rhythm.     Pulses: Normal pulses.          Radial pulses are 2+ on the right side and 2+ on the left side.       Posterior tibial pulses are 2+ on the right side and 2+ on the left side.     Heart sounds: Normal heart sounds.     Comments: Tactile temperature in the extremities appropriate and equal bilaterally. Pulmonary:     Effort: Pulmonary effort is normal. No respiratory distress.     Breath sounds: Normal breath sounds.  Abdominal:     Palpations: Abdomen is soft.     Tenderness: There is no abdominal tenderness. There is no guarding.  Musculoskeletal:     Cervical back: Neck supple.     Right lower leg: No edema.     Left lower leg: No edema.     Comments: Tenderness to the left cervical musculature flanking the cervical spine.  No point tenderness to the midline cervical spine and no swelling, deformity, instability noted.  Tenderness to the superior and anterior left shoulder without deformity, instability, or swelling noted.  Some tenderness to the proximal left humerus without deformity, instability, or swelling. No tenderness or other signs of injury over the clavicles.  No tenderness, swelling, pain, or other signs of injury to the patient's back.  Overall trauma exam performed without any abnormalities noted other than those mentioned.  Skin:    General: Skin is warm and dry.  Neurological:     Mental Status: She is alert and oriented to person, place, and time.     Comments: No noted acute cognitive deficit. Sensation  grossly intact to light touch in the extremities.   Grip strengths equal bilaterally.   Strength 5/5 in all extremities.  No gait disturbance.  Coordination intact.  Cranial nerves III-XII grossly intact.  Handles oral secretions without noted difficulty.  No noted phonation or speech deficit. No facial droop.   Psychiatric:        Mood and Affect: Mood and affect normal.        Speech: Speech normal.        Behavior: Behavior normal.     ED Results / Procedures / Treatments   Labs (all labs ordered are listed, but only abnormal results are displayed) Labs Reviewed - No data to display  EKG None  Radiology CT Head Wo Contrast  Result Date: 10/28/2020 CLINICAL DATA:  Neck trauma, midline tenderness. Head trauma, moderate/severe. Additional history provided: Patient fell head first down stairs today, left-sided neck pain. EXAM: CT HEAD WITHOUT CONTRAST CT CERVICAL SPINE WITHOUT CONTRAST TECHNIQUE: Multidetector CT imaging of the head and cervical spine was performed following the standard protocol without intravenous contrast. Multiplanar CT image reconstructions of the cervical spine were also generated. COMPARISON:  CT head/cervical spine 06/12/2018. FINDINGS: CT HEAD FINDINGS Brain: Cerebral volume is normal. There is no acute intracranial hemorrhage. No demarcated cortical infarct. No extra-axial fluid collection. No evidence of intracranial mass. No midline shift. Vascular: No hyperdense vessel. Skull: Normal. Negative for fracture or focal lesion. Sinuses/Orbits: Visualized orbits show no acute finding. Trace bilateral maxillary sinus mucosal thickening at the imaged levels. CT CERVICAL SPINE FINDINGS Alignment: Straightening of the expected cervical lordosis. No significant spondylolisthesis. Skull base and vertebrae: The basion-dental and atlanto-dental intervals are maintained.No evidence of acute fracture to the cervical spine. Soft tissues and spinal canal: No prevertebral fluid or  swelling. No visible canal hematoma.  Disc levels: Cervical spondylosis with multilevel disc bulges. Mild uncovertebral hypertrophy at C5-C6. Facet arthrosis on the left at C3-C4. No more than mild appreciable spinal canal stenosis. No significant bony neural foraminal narrowing. Upper chest: No consolidation within the imaged lung apices. No visible pneumothorax. IMPRESSION: CT head: 1. No evidence of acute intracranial abnormality. 2. Mild bilateral maxillary sinus mucosal thickening at the imaged levels. CT cervical spine: 1. No evidence of acute fracture to the cervical spine. 2. Nonspecific straightening of the expected cervical lordosis. 3. Cervical spondylosis as described. Electronically Signed   By: Kellie Simmering DO   On: 10/28/2020 11:08   CT Cervical Spine Wo Contrast  Result Date: 10/28/2020 CLINICAL DATA:  Neck trauma, midline tenderness. Head trauma, moderate/severe. Additional history provided: Patient fell head first down stairs today, left-sided neck pain. EXAM: CT HEAD WITHOUT CONTRAST CT CERVICAL SPINE WITHOUT CONTRAST TECHNIQUE: Multidetector CT imaging of the head and cervical spine was performed following the standard protocol without intravenous contrast. Multiplanar CT image reconstructions of the cervical spine were also generated. COMPARISON:  CT head/cervical spine 06/12/2018. FINDINGS: CT HEAD FINDINGS Brain: Cerebral volume is normal. There is no acute intracranial hemorrhage. No demarcated cortical infarct. No extra-axial fluid collection. No evidence of intracranial mass. No midline shift. Vascular: No hyperdense vessel. Skull: Normal. Negative for fracture or focal lesion. Sinuses/Orbits: Visualized orbits show no acute finding. Trace bilateral maxillary sinus mucosal thickening at the imaged levels. CT CERVICAL SPINE FINDINGS Alignment: Straightening of the expected cervical lordosis. No significant spondylolisthesis. Skull base and vertebrae: The basion-dental and atlanto-dental  intervals are maintained.No evidence of acute fracture to the cervical spine. Soft tissues and spinal canal: No prevertebral fluid or swelling. No visible canal hematoma. Disc levels: Cervical spondylosis with multilevel disc bulges. Mild uncovertebral hypertrophy at C5-C6. Facet arthrosis on the left at C3-C4. No more than mild appreciable spinal canal stenosis. No significant bony neural foraminal narrowing. Upper chest: No consolidation within the imaged lung apices. No visible pneumothorax. IMPRESSION: CT head: 1. No evidence of acute intracranial abnormality. 2. Mild bilateral maxillary sinus mucosal thickening at the imaged levels. CT cervical spine: 1. No evidence of acute fracture to the cervical spine. 2. Nonspecific straightening of the expected cervical lordosis. 3. Cervical spondylosis as described. Electronically Signed   By: Kellie Simmering DO   On: 10/28/2020 11:08   DG Shoulder Left  Result Date: 10/28/2020 CLINICAL DATA:  Left shoulder pain after a fall down stairs today. Initial encounter. EXAM: LEFT SHOULDER - 2+ VIEW COMPARISON:  None. FINDINGS: There is no evidence of fracture or dislocation. There is no evidence of arthropathy or other focal bone abnormality. Soft tissues are unremarkable. IMPRESSION: Negative exam. Electronically Signed   By: Inge Rise M.D.   On: 10/28/2020 11:12   DG Humerus Left  Result Date: 10/28/2020 CLINICAL DATA:  Left upper arm pain after a fall down stairs today. Initial encounter. EXAM: LEFT HUMERUS - 2+ VIEW COMPARISON:  None. FINDINGS: There is no evidence of fracture or other focal bone lesions. Soft tissues are unremarkable. IMPRESSION: Normal exam. Electronically Signed   By: Inge Rise M.D.   On: 10/28/2020 11:13   DG Hip Unilat W or Wo Pelvis 2-3 Views Left  Result Date: 10/28/2020 CLINICAL DATA:  [address] down 15 steps. EXAM: DG HIP (WITH OR WITHOUT PELVIS) 2-3V LEFT COMPARISON:  CT abdomen and pelvis 06/13/2017 FINDINGS: Pelvic bony ring is  intact. Normal appearance of both hips. Left hip is located without a fracture. Symmetric  appearance of the SI joints. IMPRESSION: No acute bone abnormality to the pelvis or left hip. Electronically Signed   By: Markus Daft M.D.   On: 10/28/2020 11:14    Procedures Procedures   Medications Ordered in ED Medications  methocarbamol (ROBAXIN) tablet 750 mg (750 mg Oral Given 10/28/20 1031)  ketorolac (TORADOL) injection 15 mg (15 mg Intramuscular Given 10/28/20 1205)    ED Course  I have reviewed the triage vital signs and the nursing notes.  Pertinent labs & imaging results that were available during my care of the patient were reviewed by me and considered in my medical decision making (see chart for details).    MDM Rules/Calculators/A&P                          Patient presents for evaluation following a slip and fall down some stairs. No focal neurologic deficits. No findings suggestive of severe head injury, however, I think CT of the head was necessary due to mechanism. I reviewed and interpreted the patient's radiological studies. No acute abnormalities noted on these imaging studies. After clear cervical spine CT, patient was able to range her neck, though she had some pain with this movement, she was able to complete this action without noted difficulty.  The patient was given instructions for home care as well as return precautions. Patient voices understanding of these instructions, accepts the plan, and is comfortable with discharge.  Initially declined any analgesia, though she accepted muscle relaxer.  At the end of the visit, however, she did accept Toradol.   Final Clinical Impression(s) / ED Diagnoses Final diagnoses:  Fall, initial encounter  Injury of head, initial encounter  Injury of neck, initial encounter  Traumatic injury of left shoulder    Rx / DC Orders ED Discharge Orders         Ordered    methocarbamol (ROBAXIN) 750 MG tablet  2 times daily PRN         10/28/20 1157    diclofenac Sodium (VOLTAREN) 1 % GEL  4 times daily        10/28/20 1157           Lorayne Bender, PA-C 10/29/20 1333    Gareth Morgan, MD 10/30/20 [phone]

## 2020-10-28 NOTE — Discharge Instructions (Signed)
Expect your soreness to increase over the next 2-3 days. Take it easy, but do not lay around too much as this may make any stiffness worse.  Acetaminophen: May take acetaminophen (generic for Tylenol), as needed, for pain. Your daily total maximum amount of acetaminophen from all sources should be limited to 4000mg /day for persons without liver problems, or 2000mg /day for those with liver problems. Diclofenac gel: This is a topical anti-inflammatory medication and can be applied directly to the painful region.  Do not use on the face or genitals.  This medication may be used as an alternative to oral anti-inflammatory medications, such as ibuprofen or naproxen. Methocarbamol: Methocarbamol (generic for Robaxin) is a muscle relaxer and can help relieve stiff muscles or muscle spasms.  Do not drive or perform other dangerous activities while taking this medication as it can cause drowsiness as well as changes in reaction time and judgement. Lidocaine patches: These are available via either prescription or over-the-counter. The over-the-counter option may be more economical one and are likely just as effective. There are multiple over-the-counter brands, such as Salonpas. Ice: May apply ice to the area over the next 24 hours for 15 minutes at a time to reduce pain, inflammation, and swelling, if present. Exercises: Be sure to perform the attached exercises starting with three times a week and working up to performing them daily. This is an essential part of preventing long term problems.  Follow up: Follow up with a primary care provider for any future management of these complaints. Be sure to follow up within 7-10 days. Return: Return to the ED should symptoms worsen.  For prescription assistance, may try using prescription discount sites or apps, such as goodrx.com   Head Injury You have been seen today for a head injury, which may or may not have resulted in a concussion. It does not appear to be  serious at this time, however, it is important to note that your presentation today is not necessarily an indication of the severity of future symptoms.  Expected symptoms: Expected symptoms of concussion and/or head injury can include nausea, headache, mild dizziness (should still be able to get up and walk around without difficulty), difficulty concentrating, increased sleep, difficulty sleeping, increased intensity of emotions. Close observation: The close observation period is usually 6 hours from the injury. This includes staying awake and having a trustworthy adult monitor you to assure your condition does not worsen. You should be in regular contact with this person and ideally, they should be able to monitor you in person.  Secondary observation: The secondary observation period is usually 24 hours from the injury. You are allowed to sleep during this time. A trustworthy adult should intermittently monitor you to assure your condition does not worsen.   Overall head injury/concussion care: Rest: Be sure to get plenty of rest. You will need more rest and sleep while you recover. Hydration: Be sure to stay well hydrated by having a goal of drinking about 0.5 liters of water an hour. Return to sports and activities: In general, you may return to normal activities once symptoms have subsided, however, you would ideally be cleared by a primary care provider or other qualified medical professional prior to return to these activities.  Follow up: Follow up with the concussion clinic or your primary care provider for further management of this issue. Return: Return to the ED should you begin to have confusion, abnormal behavior, aggression, violence, or personality changes, repeated vomiting, vision loss, numbness or weakness  on one side of the body, difficulty standing due to dizziness, significantly worsening pain, or any other major concerns.

## 2021-07-26 ENCOUNTER — Other Ambulatory Visit: Payer: Self-pay | Admitting: Orthopedic Surgery

## 2021-07-26 DIAGNOSIS — M542 Cervicalgia: Secondary | ICD-10-CM

## 2021-07-28 ENCOUNTER — Other Ambulatory Visit (HOSPITAL_BASED_OUTPATIENT_CLINIC_OR_DEPARTMENT_OTHER): Payer: Self-pay | Admitting: Orthopedic Surgery

## 2021-07-28 DIAGNOSIS — M542 Cervicalgia: Secondary | ICD-10-CM

## 2021-08-04 ENCOUNTER — Ambulatory Visit (HOSPITAL_BASED_OUTPATIENT_CLINIC_OR_DEPARTMENT_OTHER)
Admission: RE | Admit: 2021-08-04 | Discharge: 2021-08-04 | Disposition: A | Discharge status: Home / Self Care | Payer: BC Managed Care – PPO | Source: Ambulatory Visit | Attending: Orthopedic Surgery | Admitting: Orthopedic Surgery

## 2021-08-04 ENCOUNTER — Other Ambulatory Visit: Payer: Self-pay

## 2021-08-04 DIAGNOSIS — M542 Cervicalgia: Secondary | ICD-10-CM

## 2021-08-15 HISTORY — PX: HIP SURGERY: SHX245

## 2022-04-19 ENCOUNTER — Emergency Department (HOSPITAL_BASED_OUTPATIENT_CLINIC_OR_DEPARTMENT_OTHER)
Admission: EM | Admit: 2022-04-19 | Discharge: 2022-04-19 | Disposition: A | Discharge status: Home / Self Care | Payer: BC Managed Care – PPO | Attending: Emergency Medicine | Admitting: Emergency Medicine

## 2022-04-19 ENCOUNTER — Emergency Department (HOSPITAL_BASED_OUTPATIENT_CLINIC_OR_DEPARTMENT_OTHER): Payer: BC Managed Care – PPO

## 2022-04-19 ENCOUNTER — Other Ambulatory Visit: Payer: Self-pay

## 2022-04-19 DIAGNOSIS — I10 Essential (primary) hypertension: Secondary | ICD-10-CM | POA: Diagnosis not present

## 2022-04-19 DIAGNOSIS — R1084 Generalized abdominal pain: Secondary | ICD-10-CM | POA: Insufficient documentation

## 2022-04-19 DIAGNOSIS — R1032 Left lower quadrant pain: Secondary | ICD-10-CM | POA: Diagnosis present

## 2022-04-19 LAB — BASIC METABOLIC PANEL
Anion gap: 5 (ref 5–15)
BUN: 13 mg/dL (ref 6–20)
CO2: 25 mmol/L (ref 22–32)
Calcium: 8.4 mg/dL — ABNORMAL LOW (ref 8.9–10.3)
Chloride: 106 mmol/L (ref 98–111)
Creatinine, Ser: 0.69 mg/dL (ref 0.44–1.00)
GFR, Estimated: >60 mL/min (ref >60)
Glucose, Bld: 105 mg/dL — ABNORMAL HIGH (ref 70–99)
Potassium: 3.9 mmol/L (ref 3.5–5.1)
Sodium: 136 mmol/L (ref 135–145)

## 2022-04-19 LAB — CBC
HCT: 38.6 % (ref 36.0–46.0)
Hemoglobin: 12.9 g/dL (ref 12.0–15.0)
MCH: 30.2 pg (ref 26.0–34.0)
MCHC: 33.4 g/dL (ref 30.0–36.0)
MCV: 90.4 fL (ref 80.0–100.0)
Platelets: 259 10*3/uL (ref 150–400)
RBC: 4.27 MIL/uL (ref 3.87–5.11)
RDW: 12.3 % (ref 11.5–15.5)
WBC: 9.2 10*3/uL (ref 4.0–10.5)
nRBC: 0 % (ref 0.0–0.2)

## 2022-04-19 LAB — HEPATIC FUNCTION PANEL
ALT: 16 U/L (ref 0–44)
AST: 20 U/L (ref 15–41)
Albumin: 4.1 g/dL (ref 3.5–5.0)
Alkaline Phosphatase: 50 U/L (ref 38–126)
Bilirubin, Direct: 0.1 mg/dL (ref 0.0–0.2)
Indirect Bilirubin: 0.4 mg/dL (ref 0.3–0.9)
Total Bilirubin: 0.5 mg/dL (ref 0.3–1.2)
Total Protein: 6.8 g/dL (ref 6.5–8.1)

## 2022-04-19 LAB — URINALYSIS, ROUTINE W REFLEX MICROSCOPIC
Bilirubin Urine: NEGATIVE
Glucose, UA: NEGATIVE mg/dL
Hgb urine dipstick: NEGATIVE
Ketones, ur: NEGATIVE mg/dL
Leukocytes,Ua: NEGATIVE
Nitrite: NEGATIVE
Protein, ur: NEGATIVE mg/dL
Specific Gravity, Urine: 1.015 (ref 1.005–1.030)
pH: 7 (ref 5.0–8.0)

## 2022-04-19 LAB — LIPASE, BLOOD: Lipase: 35 U/L (ref 11–51)

## 2022-04-19 MED ORDER — OXYCODONE-ACETAMINOPHEN 5-325 MG PO TABS
1.0000 | ORAL_TABLET | ORAL | Status: DC | PRN
  Administered 2022-04-19: 1 via ORAL
  Filled 2022-04-19: qty 1

## 2022-04-19 NOTE — ED Triage Notes (Signed)
C/O left lower abdominal pain radiating to the left flank area; denies painful urination.

## 2022-04-19 NOTE — ED Provider Notes (Signed)
[facility] EMERGENCY DEPARTMENT Provider Note   CSN: [id number] Arrival date & time: 04/19/22  1534     History  Chief Complaint  Patient presents with   Abdominal Pain    Kristin Church is a 46 y.o. female.  Patient here with left lower abdominal pain.  History of partial hysterectomy.  History of hypertension.  Pain on and off for several weeks.  Has had some like this before in the past.  Denies any history of kidney stone.  Denies urinary symptoms.  Nothing makes it worse or better.  Denies any fevers or chills.  No chest pain or shortness of breath or nausea or vomiting.  The history is provided by the patient.       Home Medications Prior to Admission medications   Medication Sig Start Date End Date Taking? Authorizing Provider  diclofenac Sodium (VOLTAREN) 1 % GEL Apply 4 g topically 4 (four) times daily. 10/28/20   Joy, Shawn C, PA-C  ferrous sulfate 325 (65 FE) MG EC tablet Take 325 mg by mouth at bedtime.    [provider]  ibuprofen (ADVIL,MOTRIN) 600 MG tablet Take 1 tablet (600 mg total) by mouth every 6 (six) hours as needed (mild pain). Patient not taking: Reported on 06/18/2017 05/18/17   Louretta Shorten, MD  ibuprofen (ADVIL,MOTRIN) 600 MG tablet Take 1 tablet (600 mg total) by mouth every 6 (six) hours as needed (mild pain). 06/16/17   Louretta Shorten, MD  methocarbamol (ROBAXIN) 750 MG tablet Take 1 tablet (750 mg total) by mouth 2 (two) times daily as needed for muscle spasms (or muscle tightness). 10/28/20   Joy, Shawn C, PA-C  Omega-3 Fatty Acids (FISH OIL PO) Take 1 capsule by mouth daily.     [provider]  oxyCODONE-acetaminophen (PERCOCET/ROXICET) 5-325 MG tablet Take 1-2 tablets by mouth every 4 (four) hours as needed for severe pain (moderate to severe pain (when tolerating fluids)). 06/16/17   Louretta Shorten, MD  polyethylene glycol powder (MIRALAX) powder Take 17 g daily by mouth. 06/18/17   Degele, Jenne Pane, MD  Prenatal Vit-Fe  Fumarate-FA (PRENATAL MULTIVITAMIN) TABS Take 1 tablet by mouth daily.    [provider]  sertraline (ZOLOFT) 100 MG tablet Take 100 mg by mouth daily.    [provider]      Allergies    Adhesive [tape], Fructose, and Other    Review of Systems   Review of Systems  Physical Exam Updated Vital Signs BP 127/83 (BP Location: Left Arm)   Pulse 70   Temp 98 F (36.7 C) (Oral)   Resp 18   Ht '5\' 2"'$  (1.575 m)   Wt 62.1 kg   LMP 05/16/2017   SpO2 100%   BMI 25.06 kg/m  Physical Exam Vitals and nursing note reviewed.  Constitutional:      General: She is not in acute distress.    Appearance: She is well-developed. She is not ill-appearing.  HENT:     Head: Normocephalic and atraumatic.     Nose: Nose normal.     Mouth/Throat:     Mouth: Mucous membranes are moist.  Eyes:     Extraocular Movements: Extraocular movements intact.     Conjunctiva/sclera: Conjunctivae normal.     Pupils: Pupils are equal, round, and reactive to light.  Cardiovascular:     Rate and Rhythm: Normal rate and regular rhythm.     Pulses: Normal pulses.     Heart sounds: Normal heart sounds. No  murmur heard. Pulmonary:     Effort: Pulmonary effort is normal. No respiratory distress.     Breath sounds: Normal breath sounds.  Abdominal:     Palpations: Abdomen is soft.     Tenderness: There is abdominal tenderness.  Musculoskeletal:        General: No swelling.     Cervical back: Neck supple.  Skin:    General: Skin is warm and dry.     Capillary Refill: Capillary refill takes less than 2 seconds.  Neurological:     General: No focal deficit present.     Mental Status: She is alert and oriented to person, place, and time.     Cranial Nerves: No cranial nerve deficit.     Sensory: No sensory deficit.     Motor: No weakness.     Coordination: Coordination normal.  Psychiatric:        Mood and Affect: Mood normal.     ED Results / Procedures / Treatments   Labs (all labs  ordered are listed, but only abnormal results are displayed) Labs Reviewed  BASIC METABOLIC PANEL - Abnormal; Notable for the following components:      Result Value   Glucose, Bld 105 (*)    Calcium 8.4 (*)    All other components within normal limits  URINALYSIS, ROUTINE W REFLEX MICROSCOPIC  CBC  HEPATIC FUNCTION PANEL  LIPASE, BLOOD    EKG None  Radiology CT Renal Stone Study  Result Date: 04/19/2022 CLINICAL DATA:  Left flank pain EXAM: CT ABDOMEN AND PELVIS WITHOUT CONTRAST TECHNIQUE: Multidetector CT imaging of the abdomen and pelvis was performed following the standard protocol without IV contrast. RADIATION DOSE REDUCTION: This exam was performed according to the departmental dose-optimization program which includes automated exposure control, adjustment of the mA and/or kV according to patient size and/or use of iterative reconstruction technique. COMPARISON:  None Available. FINDINGS: Lower chest: No acute abnormality. Hepatobiliary: No focal liver abnormality is seen. No gallstones, gallbladder wall thickening, or biliary dilatation. Pancreas: Unremarkable. No pancreatic ductal dilatation or surrounding inflammatory changes. Spleen: Normal in size without focal abnormality. Adrenals/Urinary Tract: Adrenal glands are unremarkable. Kidneys are normal, without renal calculi, focal lesion, or hydronephrosis. Bladder is unremarkable. Stomach/Bowel: Stomach is within normal limits. Appendix appears normal. No evidence of bowel wall thickening, distention, or inflammatory changes. Vascular/Lymphatic: No significant vascular findings are present. No enlarged abdominal or pelvic lymph nodes. Reproductive: Surgical changes suggest prior partial hysterectomy Other: No abdominal wall hernia or abnormality. No abdominopelvic ascites. Musculoskeletal: No acute or significant osseous findings. IMPRESSION: No acute abnormality within the abdomen or pelvis. Electronically Signed   By: Jacqulynn Cadet  M.D.   On: 04/19/2022 16:13    Procedures Procedures    Medications Ordered in ED Medications  oxyCODONE-acetaminophen (PERCOCET/ROXICET) 5-325 MG per tablet 1 tablet (1 tablet Oral Given 04/19/22 1549)    ED Course/ Medical Decision Making/ A&P                           Medical Decision Making Amount and/or Complexity of Data Reviewed Labs: ordered. Radiology: ordered.  Risk Prescription drug management.   Macon Large is here with left lower abdominal pain.  Normal vitals.  No fever.  Differential diagnosis is diverticulitis versus kidney stone versus UTI.  She had a partial hysterectomy.  Seems less likely to be GU in origin.  However could be scar tissue pain.  We will get CBC, CMP,  lipase, urinalysis, CT scan abdomen and pelvis.  Symptoms been ongoing for a while.  This could be of chronic process.  Per my review and interpretation of labs no significant anemia, electrolyte abnormality, kidney injury, leukocytosis.  Lab work is overall unremarkable.  CT scan per radiology report shows no acute findings.  She has no significant comorbidities.  Overall suspect this is a chronic process.  Could be some chronic pelvic pain process versus muscle skeletal process.  Recommend follow-up primary care doctor.  Discharged in good condition.  No concern for other emergent process.  This chart was dictated using voice recognition software.  Despite best efforts to proofread,  errors can occur which can change the documentation meaning.         Final Clinical Impression(s) / ED Diagnoses Final diagnoses:  Generalized abdominal pain    Rx / DC Orders ED Discharge Orders     None         Lennice Sites, DO 04/19/22 1827

## 2022-06-08 ENCOUNTER — Other Ambulatory Visit: Payer: Self-pay | Admitting: Obstetrics and Gynecology
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
## 2023-11-01 ENCOUNTER — Other Ambulatory Visit: Payer: Self-pay | Admitting: Obstetrics and Gynecology

## 2023-11-01 DIAGNOSIS — N6310 Unspecified lump in the right breast, unspecified quadrant: Secondary | ICD-10-CM

## 2023-11-03 ENCOUNTER — Other Ambulatory Visit: Payer: Self-pay | Admitting: Obstetrics and Gynecology

## 2023-11-03 DIAGNOSIS — N6310 Unspecified lump in the right breast, unspecified quadrant: Secondary | ICD-10-CM

## 2023-11-06 ENCOUNTER — Ambulatory Visit
Admission: RE | Admit: 2023-11-06 | Discharge: 2023-11-06 | Disposition: A | Discharge status: Home / Self Care | Source: Ambulatory Visit | Attending: Obstetrics and Gynecology | Admitting: Obstetrics and Gynecology

## 2023-11-06 DIAGNOSIS — N6310 Unspecified lump in the right breast, unspecified quadrant: Secondary | ICD-10-CM

## 2023-11-06 HISTORY — PX: BREAST BIOPSY: SHX20

## 2023-11-07 LAB — SURGICAL PATHOLOGY

## 2024-02-27 ENCOUNTER — Observation Stay (HOSPITAL_COMMUNITY)
Admission: AD | Admit: 2024-02-27 | Discharge: 2024-02-29 | Disposition: A | Discharge status: Home / Self Care | Attending: Obstetrics and Gynecology | Admitting: Obstetrics and Gynecology

## 2024-02-27 ENCOUNTER — Other Ambulatory Visit: Payer: Self-pay

## 2024-02-27 ENCOUNTER — Encounter (HOSPITAL_COMMUNITY): Payer: Self-pay | Admitting: Obstetrics & Gynecology

## 2024-02-27 DIAGNOSIS — Z9889 Other specified postprocedural states: Secondary | ICD-10-CM

## 2024-02-27 DIAGNOSIS — N83291 Other ovarian cyst, right side: Principal | ICD-10-CM | POA: Insufficient documentation

## 2024-02-27 DIAGNOSIS — N83201 Unspecified ovarian cyst, right side: Principal | ICD-10-CM | POA: Diagnosis present

## 2024-02-27 DIAGNOSIS — D649 Anemia, unspecified: Secondary | ICD-10-CM | POA: Insufficient documentation

## 2024-02-27 DIAGNOSIS — R1031 Right lower quadrant pain: Secondary | ICD-10-CM | POA: Insufficient documentation

## 2024-02-27 DIAGNOSIS — K661 Hemoperitoneum: Secondary | ICD-10-CM | POA: Diagnosis not present

## 2024-02-27 HISTORY — DX: Other specified postprocedural states: Z98.890

## 2024-02-27 MED ORDER — DEXTROSE IN LACTATED RINGERS 5 % IV SOLN: INTRAVENOUS | Status: DC

## 2024-02-27 MED ORDER — TRANEXAMIC ACID-NACL 1000-0.7 MG/100ML-% IV SOLN
1000.0000 mg | Freq: Once | INTRAVENOUS | Status: AC
Start: 2024-02-28 — End: 2024-02-28
  Administered 2024-02-28: 1000 mg via INTRAVENOUS
  Filled 2024-02-27: qty 100

## 2024-02-27 MED ORDER — HYDROMORPHONE HCL 1 MG/ML IJ SOLN
0.5000 mg | INTRAMUSCULAR | Status: DC | PRN
  Administered 2024-02-28: 1 mg via INTRAVENOUS
  Filled 2024-02-27: qty 1

## 2024-02-27 MED ORDER — SODIUM CHLORIDE 0.9 % IV SOLN
2.0000 g | Freq: Two times a day (BID) | INTRAVENOUS | Status: AC
  Administered 2024-02-28: 2 g via INTRAVENOUS
  Filled 2024-02-27 (×2): qty 2

## 2024-02-27 NOTE — H&P (Signed)
 Kristin Church is a 48 yo WF s/p hysterectomy and removal of left ovary and bilateral tubes Acute RLQ on 02/24/24 and admitted to [facility] in [place].  CT and US  showed right adnexal cyst with hemorrhagic component.  Hgb 12.3 and wbc 8.9 She left 02/25/24 and followed up today 02/27/24 in office for routine fu.  She reports pain has increased since her d/c home  CT showed right adnexal blood US  confirms but no measurements or blood flow recorded at [facility]  Pain has worsened since release from [facility] .SABRA Still no changes in bowel or bladder habits. today she appears uncomfortable with movement  Patient's Care Team Notes: no PCP Patient's Pharmacies [facility] [phone] [facility]): [address], [address], [place], [place] [address], Ph [phone], Fax [phone] Vitals Wt: 136 lbs 02/27/2024 02:32 pm Ht: 5 ft 2.25 in 02/27/2024 02:32 pm BMI: 24.7 02/27/2024 02:32 pm BP: 138/74 02/27/2024 02:34 pm Allergies Reviewed Allergies ADHESIVE  Medications Reviewed Medications oxyCODONE  02/27/24   entered Terena Dillard Vaccines Vaccines not reviewed (last reviewed 06/27/2023) Vaccine Type Date Amt. Route Site NDC Lot # Mfr. Exp. Date VIS VIS Given Vaccinator Influenza influenza, injectable, quadrivalent, preservative free 05/30/19 0.5 mL Intramuscular Deltoid, Left  [id number] Seqirus 01/07/20 Inactivated Influenza 03/29/2018 05/30/19 Ronal Leeroy BROCKS Pegram (TERMED) Problems Problems not reviewed (last reviewed 10/24/2023) At increased risk of malignant neoplasm of breast - Onset: 06/27/2023 - 44.2% lifetime breast ca risk Dense breast on MMG cat D Migraine - Onset: 11/26/2019 Family History Reviewed Family History Mother - Family history of malignant neoplasm of ovary (onset age: 54)   - Hypertensive disorder   - Rheumatoid arthritis Maternal Aunt - Family history of malignant neoplasm of ovary (onset age: 84) Maternal Grandmother - Malignant tumor of breast (onset age: 2) Maternal Grandfather -  Malignant tumor of colon Paternal Grandfather - Heart disease Father - Hypertensive disorder Social History Reviewed Social History Marriage and Sexuality What is your relationship status?: Separated Are you sexually active?: Yes Substance Use Do you or have you ever smoked tobacco?: Never smoker What was the date of your most recent tobacco screening?: 02/27/2024 What is your level of alcohol consumption?: None Do you use any illicit or recreational drugs?: No Diet and Exercise What is your exercise level?: Moderate Education and Occupation Are you currently employed?: Yes 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 History of domestic violence: No Other Marital status: Separated Performs monthly self-breast exam?: Y General stress level: High Gender Identity and LGBTQ Identity Sexual orientation: Straight or heterosexual Surgical & Procedure History Surgical & Procedure History not reviewed (last reviewed 06/27/2023) Left oophorectomy - 06/08/2022 Arthroscopy of hip - 01/06/2021 - Left Hip Total excision of bilateral fallopian tubes - 06/14/2017 Laparoscopic-assisted vaginal hysterectomy - 06/14/2017 GYN- Endometrial Ablation - 05/11/2017 Cesarean section - 03/25/2016 Ligation of bilateral fallopian tubes - 03/25/2016 Cesarean section - 07/22/2012  traumatic tumor/cyst removed right breast 12-2019 GYN History Reviewed GYN History Date of LMP: (Notes: LAV[facility]). Date of Last Pap Smear: 05/12/2021 (Notes: WNL/Next papsmear 2025.). History of Abnormal PAP: N. Date of Last Mammogram: 06/27/2023 (Notes: [facility] 11/06/2023/TBC/Right Breast Biopsy/Fibroadenoma.). Date of Last Colonoscopy: 12/14/2019 (Notes: q 5 yrs). Date of last bone density: 06/27/2023 (Notes: Normal). Sexually Active: Y. History of Sexually Transmitted Infection: N. Current Birth Control Method:: Tubal Ligation. Obstetric History Reviewed Obstetric History TOTAL FULL PRE AB. I AB.  S ECTOPICS MULTIPLE LIVING 2 2 0 0 0 0 0 2 07/22/2012/C-sect/Beverly Suriano/F/Ava Elizabeth. 03/25/2016/C-sect/Tagan Bartram/F/Amelia. Past Pregnancies Date # Fetuses GA Wks Labor  Length Birth Weight Sex Delivery Type Outcome Anesthesia Delivery Place Preterm Labor Notes Source 07/22/2012 1      Cesarean Section      historical 03/25/2016 1     f Cesarean Section   [id number] [facility]   episode Past Medical History Past Medical History not reviewed (last reviewed 04/27/2022) Cancer- Genetic screening: Y - 06/06/2017 ID- Chicken Pox/Shingles: Y - both Screening None recorded. HPI [facility] [place] [facility]. Admit Date 02/24/2024 for RLQ pain. Discharge Date 02/25/2024 Ruptured cyst of ovary. CONTINUES TO HAVE PAIN, WAS PRESCRIBED OXYCODONE  - NO RELIEF ROS Constitutional: Constitutional: no significant weight gain or loss and no fatigue or fever.  Skin: Skin: no rashes or abnormal moles.  Respiratory: Respiratory: no wheezing or dyspnea / shortness of breath.  Cardiovascular: Cardiovascular: no palpitations or chest pain.  Gastrointestinal: Gastrointestinal: no heartburn, dysphagia, nausea, vomiting, diarrhea, constipation, bowel movement changes, or rectal bleeding and abdominal pain.  Genitourinary: Genitourinary: no vaginal odor or itching and no hematuria, incontinence, rash, lesion, discharge, abnormal bleeding, flank pain, or trouble urinating.  Endocrine: Menstrual: no menstrual problems or PMDD symptoms. Menopausal: no menopausal symptoms. Sexual: no sexual problems.  Musculoskeletal: Musculoskeletal: no muscle aches or weakness and no arthralgias/joint pain or back pain.  Neurological: Neurologic: no headaches, dizziness, LOC, weakness, or numbness.  Psychological: Psych: no depression, alcoholism, or sleep disturbances. Physical Exam Constitutional: *General Appearance: healthy-appearing, well-nourished, and well-developed.  Cardiovascular:  *Auscultation: RRR and no murmur. *Peripheral Vascular: no varicosities or edema.  Lungs: *Auscultation: clear to auscultation. Inspection: normal and normal respiratory rate.  Abdomen: *Inspection/Palpation/Auscultation: no rebound, guarding, hepatomegaly, or splenomegaly and non-distended, soft, scar, tenderness, and normal bowel sounds; normal BS mild diffuse tenderness most in RLQ palpation pain 2/5, no guarding or rebound. *Hernia: none palpated.  Back: Appearance normal. Palpation no costovertebral angle tenderness.  Female Genitalia: Vulva: no masses, atrophy, or lesions. Introitus: normal. Bartholin's Gland: normal. *Vagina: no discharge, erythema, atrophy, lesions, ulcers, swelling, masses, tenderness, prolapse, or blood present and normal. *Urethral Meatus/ Urethra: no discharge, masses, or tenderness and normal meatus and well supported urethra. *Bladder: non-distended, non-tender, and no palpable mass. *Adnexa/Parametria: no tenderness or mass palpable; fullness on right adnexal and 3/5 tenderness, no rebound or guarding.  Extremities: Legs: normal.  Skin: *Appearance: no rashes or lesions.  Psychiatric: *Orientation: to person, place, and time. *Mood and Affect: normal mood and affect and active and alert. Assessment / Plan 1.  Unspecified ovarian cyst, right side  N83.201: Unspecified ovarian cyst, right side US , PELVIS  2.  Complex cyst of right ovary  9x8cm with what appears to blood. No flow noted in the clot. discussed and recommended stat CBC and rec surgical evaluation of the adnexa Sherriann wants preservation of her remaining right ovary if possible Rec laparoscopic Right ovarian cystectomy, possible oopherectomy Discussed the risks and benefits of the procedure including but not limited to infection, bleeding, damage to bowel, bladder, ureters, and risks associated with blood transfusion. We reviewed the procedure at length including the recovery. All here questions  were answered and she gives informed consent. Surgery planned for tomorrow. Offered admission tonight for IV fluids/pain meds and monitoring. She does not appear unstable, but she is having more pain and has percocet at home Will call with results of her CBC and if and sig changes, will admit tonight, otherwise as an outpatient tomorrow.  CBC returned this evening with WBC 13.4 and hgb 8.0.  I called her and recommended admission to  hospital tonight for monitoring vitals, IVF, IV abx and will T&H 2 u PRBCs.  Plan to proceed with surgery tomorrow as planned.  Will transfuse if necessary. W16.708: Other ovarian cyst, right side

## 2024-02-28 ENCOUNTER — Other Ambulatory Visit: Payer: Self-pay

## 2024-02-28 ENCOUNTER — Encounter (HOSPITAL_COMMUNITY)
Admission: AD | Disposition: A | Discharge status: Home / Self Care | Payer: Self-pay | Source: Home / Self Care | Attending: Obstetrics and Gynecology

## 2024-02-28 ENCOUNTER — Inpatient Hospital Stay (HOSPITAL_COMMUNITY): Admitting: Anesthesiology

## 2024-02-28 ENCOUNTER — Encounter (HOSPITAL_COMMUNITY): Payer: Self-pay | Admitting: Obstetrics and Gynecology

## 2024-02-28 ENCOUNTER — Ambulatory Visit (HOSPITAL_COMMUNITY): Admit: 2024-02-28 | Admitting: Obstetrics and Gynecology

## 2024-02-28 DIAGNOSIS — N83291 Other ovarian cyst, right side: Secondary | ICD-10-CM | POA: Diagnosis not present

## 2024-02-28 DIAGNOSIS — R1031 Right lower quadrant pain: Secondary | ICD-10-CM | POA: Diagnosis not present

## 2024-02-28 DIAGNOSIS — K661 Hemoperitoneum: Secondary | ICD-10-CM | POA: Diagnosis not present

## 2024-02-28 DIAGNOSIS — D649 Anemia, unspecified: Secondary | ICD-10-CM | POA: Diagnosis not present

## 2024-02-28 DIAGNOSIS — Z9889 Other specified postprocedural states: Secondary | ICD-10-CM

## 2024-02-28 LAB — COMPREHENSIVE METABOLIC PANEL WITH GFR
ALT: 11 U/L (ref 0–44)
AST: 13 U/L — ABNORMAL LOW (ref 15–41)
Albumin: 2.8 g/dL — ABNORMAL LOW (ref 3.5–5.0)
Alkaline Phosphatase: 39 U/L (ref 38–126)
Anion gap: 10 (ref 5–15)
BUN: 5 mg/dL — ABNORMAL LOW (ref 6–20)
CO2: 23 mmol/L (ref 22–32)
Calcium: 8 mg/dL — ABNORMAL LOW (ref 8.9–10.3)
Chloride: 104 mmol/L (ref 98–111)
Creatinine, Ser: 0.6 mg/dL (ref 0.44–1.00)
GFR, Estimated: >60 mL/min (ref >60)
Glucose, Bld: 133 mg/dL — ABNORMAL HIGH (ref 70–99)
Potassium: 4 mmol/L (ref 3.5–5.1)
Sodium: 137 mmol/L (ref 135–145)
Total Bilirubin: 0.5 mg/dL (ref 0.0–1.2)
Total Protein: 5.9 g/dL — ABNORMAL LOW (ref 6.5–8.1)

## 2024-02-28 LAB — CBC
HCT: 21.6 % — ABNORMAL LOW (ref 36.0–46.0)
HCT: 21.1 % — ABNORMAL LOW (ref 36.0–46.0)
HCT: 22.6 % — ABNORMAL LOW (ref 36.0–46.0)
Hemoglobin: 7.3 g/dL — ABNORMAL LOW (ref 12.0–15.0)
Hemoglobin: 7.2 g/dL — ABNORMAL LOW (ref 12.0–15.0)
Hemoglobin: 7.7 g/dL — ABNORMAL LOW (ref 12.0–15.0)
MCH: 30.5 pg (ref 26.0–34.0)
MCH: 30.8 pg (ref 26.0–34.0)
MCH: 30.6 pg (ref 26.0–34.0)
MCHC: 33.8 g/dL (ref 30.0–36.0)
MCHC: 34.1 g/dL (ref 30.0–36.0)
MCHC: 34.1 g/dL (ref 30.0–36.0)
MCV: 90.4 fL (ref 80.0–100.0)
MCV: 90.2 fL (ref 80.0–100.0)
MCV: 89.7 fL (ref 80.0–100.0)
Platelets: 206 K/uL (ref 150–400)
Platelets: 197 K/uL (ref 150–400)
Platelets: 203 K/uL (ref 150–400)
RBC: 2.39 MIL/uL — ABNORMAL LOW (ref 3.87–5.11)
RBC: 2.34 MIL/uL — ABNORMAL LOW (ref 3.87–5.11)
RBC: 2.52 MIL/uL — ABNORMAL LOW (ref 3.87–5.11)
RDW: 11.9 % (ref 11.5–15.5)
RDW: 12 % (ref 11.5–15.5)
RDW: 12 % (ref 11.5–15.5)
WBC: 9 K/uL (ref 4.0–10.5)
WBC: 7.3 K/uL (ref 4.0–10.5)
WBC: 9.5 K/uL (ref 4.0–10.5)
nRBC: 0 % (ref 0.0–0.2)
nRBC: 0 % (ref 0.0–0.2)
nRBC: 0 % (ref 0.0–0.2)

## 2024-02-28 LAB — PREPARE RBC (CROSSMATCH)

## 2024-02-28 SURGERY — OOPHORECTOMY, LAPAROSCOPIC
Anesthesia: General | Site: Pelvis | Laterality: Right

## 2024-02-28 MED ORDER — ACETAMINOPHEN 500 MG PO TABS: 1000.0000 mg | ORAL_TABLET | Freq: Once | ORAL | Status: DC

## 2024-02-28 MED ORDER — ESTRADIOL 0.1 MG/24HR TD PTWK
0.1000 mg | MEDICATED_PATCH | TRANSDERMAL | Status: DC
  Administered 2024-02-28 – 2024-02-29 (×2): 0.1 mg via TRANSDERMAL
  Filled 2024-02-28 (×2): qty 1

## 2024-02-28 MED ORDER — PROPOFOL 10 MG/ML IV BOLUS
INTRAVENOUS | Status: AC
  Filled 2024-02-28: qty 20

## 2024-02-28 MED ORDER — CHLORHEXIDINE GLUCONATE 0.12 % MT SOLN: 15.0000 mL | Freq: Once | OROMUCOSAL | Status: AC

## 2024-02-28 MED ORDER — MIDAZOLAM HCL 2 MG/2ML IJ SOLN
INTRAMUSCULAR | Status: AC
Start: 2024-02-28 — End: 2024-02-28
  Filled 2024-02-28: qty 2

## 2024-02-28 MED ORDER — OXYCODONE-ACETAMINOPHEN 5-325 MG PO TABS
1.0000 | ORAL_TABLET | ORAL | Status: DC | PRN
  Administered 2024-02-28: 1 via ORAL
  Filled 2024-02-28: qty 1

## 2024-02-28 MED ORDER — ALUM & MAG HYDROXIDE-SIMETH 200-200-20 MG/5ML PO SUSP
30.0000 mL | ORAL | Status: DC | PRN
Start: 2024-02-28 — End: 2024-02-29

## 2024-02-28 MED ORDER — ROCURONIUM BROMIDE 10 MG/ML (PF) SYRINGE
PREFILLED_SYRINGE | INTRAVENOUS | Status: DC | PRN
  Administered 2024-02-28: 40 mg via INTRAVENOUS
  Administered 2024-02-28: 20 mg via INTRAVENOUS

## 2024-02-28 MED ORDER — ONDANSETRON HCL 4 MG/2ML IJ SOLN
INTRAMUSCULAR | Status: DC | PRN
  Administered 2024-02-28: 4 mg via INTRAVENOUS

## 2024-02-28 MED ORDER — OXYCODONE HCL 5 MG PO TABS: 5.0000 mg | ORAL_TABLET | Freq: Once | ORAL | Status: DC | PRN

## 2024-02-28 MED ORDER — SODIUM CHLORIDE 0.9 % IR SOLN
Status: DC | PRN
  Administered 2024-02-28: 3000 mL
  Administered 2024-02-28: 1000 mL
  Administered 2024-02-28: 3000 mL

## 2024-02-28 MED ORDER — SUGAMMADEX SODIUM 200 MG/2ML IV SOLN
INTRAVENOUS | Status: AC
  Filled 2024-02-28: qty 4

## 2024-02-28 MED ORDER — CHLORHEXIDINE GLUCONATE 0.12 % MT SOLN
OROMUCOSAL | Status: AC
  Administered 2024-02-28: 15 mL via OROMUCOSAL
  Filled 2024-02-28: qty 15

## 2024-02-28 MED ORDER — SUGAMMADEX SODIUM 200 MG/2ML IV SOLN
INTRAVENOUS | Status: DC | PRN
  Administered 2024-02-28: 200 mg via INTRAVENOUS

## 2024-02-28 MED ORDER — SODIUM CHLORIDE 0.9 % IV SOLN: INTRAVENOUS | Status: DC | PRN

## 2024-02-28 MED ORDER — LIDOCAINE 2% (20 MG/ML) 5 ML SYRINGE
INTRAMUSCULAR | Status: DC | PRN
  Administered 2024-02-28: 60 mg via INTRAVENOUS

## 2024-02-28 MED ORDER — ROCURONIUM BROMIDE 10 MG/ML (PF) SYRINGE
PREFILLED_SYRINGE | INTRAVENOUS | Status: AC
Start: 2024-02-28 — End: 2024-02-28
  Filled 2024-02-28: qty 10

## 2024-02-28 MED ORDER — DEXAMETHASONE SODIUM PHOSPHATE 10 MG/ML IJ SOLN
INTRAMUSCULAR | Status: DC | PRN
  Administered 2024-02-28: 10 mg via INTRAVENOUS

## 2024-02-28 MED ORDER — MENTHOL 3 MG MT LOZG: 1.0000 | LOZENGE | OROMUCOSAL | Status: DC | PRN

## 2024-02-28 MED ORDER — METHOCARBAMOL 750 MG PO TABS: 750.0000 mg | ORAL_TABLET | Freq: Two times a day (BID) | ORAL | Status: DC | PRN

## 2024-02-28 MED ORDER — BUPIVACAINE HCL (PF) 0.25 % IJ SOLN
INTRAMUSCULAR | Status: DC | PRN
  Administered 2024-02-28: 10 mL

## 2024-02-28 MED ORDER — ONDANSETRON HCL 4 MG/2ML IJ SOLN
INTRAMUSCULAR | Status: AC
  Filled 2024-02-28: qty 2

## 2024-02-28 MED ORDER — TRAMADOL HCL 50 MG PO TABS: 50.0000 mg | ORAL_TABLET | Freq: Four times a day (QID) | ORAL | Status: DC | PRN

## 2024-02-28 MED ORDER — FENTANYL CITRATE (PF) 250 MCG/5ML IJ SOLN
INTRAMUSCULAR | Status: DC | PRN
  Administered 2024-02-28 (×2): 50 ug via INTRAVENOUS
  Administered 2024-02-28: 100 ug via INTRAVENOUS

## 2024-02-28 MED ORDER — FENTANYL CITRATE (PF) 250 MCG/5ML IJ SOLN
INTRAMUSCULAR | Status: AC
  Filled 2024-02-28: qty 5

## 2024-02-28 MED ORDER — ZOLPIDEM TARTRATE 5 MG PO TABS
5.0000 mg | ORAL_TABLET | Freq: Every evening | ORAL | Status: DC | PRN
Start: 2024-02-28 — End: 2024-02-29

## 2024-02-28 MED ORDER — SERTRALINE HCL 100 MG PO TABS: 100.0000 mg | ORAL_TABLET | Freq: Every day | ORAL | Status: DC

## 2024-02-28 MED ORDER — FENTANYL CITRATE (PF) 100 MCG/2ML IJ SOLN: 25.0000 ug | INTRAMUSCULAR | Status: DC | PRN

## 2024-02-28 MED ORDER — TRANEXAMIC ACID-NACL 1000-0.7 MG/100ML-% IV SOLN
INTRAVENOUS | Status: DC | PRN
  Administered 2024-02-28: 1000 mg via INTRAVENOUS

## 2024-02-28 MED ORDER — SIMETHICONE 80 MG PO CHEW
80.0000 mg | CHEWABLE_TABLET | Freq: Four times a day (QID) | ORAL | Status: DC | PRN
  Administered 2024-02-28 – 2024-02-29 (×2): 80 mg via ORAL
  Filled 2024-02-28 (×2): qty 1

## 2024-02-28 MED ORDER — DEXTROSE-SODIUM CHLORIDE 5-0.45 % IV SOLN: INTRAVENOUS | Status: AC

## 2024-02-28 MED ORDER — LIDOCAINE 2% (20 MG/ML) 5 ML SYRINGE
INTRAMUSCULAR | Status: AC
  Filled 2024-02-28: qty 5

## 2024-02-28 MED ORDER — FERROUS SULFATE 325 (65 FE) MG PO TBEC
325.0000 mg | DELAYED_RELEASE_TABLET | Freq: Every day | ORAL | Status: DC
  Administered 2024-02-28: 325 mg via ORAL
  Filled 2024-02-28 (×2): qty 1

## 2024-02-28 MED ORDER — DEXAMETHASONE SODIUM PHOSPHATE 10 MG/ML IJ SOLN
INTRAMUSCULAR | Status: AC
  Filled 2024-02-28: qty 1

## 2024-02-28 MED ORDER — TRANEXAMIC ACID-NACL 1000-0.7 MG/100ML-% IV SOLN
INTRAVENOUS | Status: AC
Start: 2024-02-28 — End: 2024-02-28
  Filled 2024-02-28: qty 100

## 2024-02-28 MED ORDER — PROPOFOL 10 MG/ML IV BOLUS
INTRAVENOUS | Status: DC | PRN
  Administered 2024-02-28: 100 mg via INTRAVENOUS
  Administered 2024-02-28: 125 ug/kg/min via INTRAVENOUS
  Administered 2024-02-28 (×2): 50 mg via INTRAVENOUS

## 2024-02-28 MED ORDER — MIDAZOLAM HCL 2 MG/2ML IJ SOLN
INTRAMUSCULAR | Status: DC | PRN
  Administered 2024-02-28: 2 mg via INTRAVENOUS

## 2024-02-28 MED ORDER — IBUPROFEN 600 MG PO TABS: 600.0000 mg | ORAL_TABLET | Freq: Four times a day (QID) | ORAL | Status: DC | PRN

## 2024-02-28 MED ORDER — ORAL CARE MOUTH RINSE: 15.0000 mL | Freq: Once | OROMUCOSAL | Status: AC

## 2024-02-28 MED ORDER — POVIDONE-IODINE 10 % EX SWAB
2.0000 | Freq: Once | CUTANEOUS | Status: AC
  Administered 2024-02-28: 2 via TOPICAL

## 2024-02-28 MED ORDER — SODIUM CHLORIDE 0.9 % IV SOLN
INTRAVENOUS | Status: DC | PRN
  Administered 2024-02-28: 2 g via INTRAVENOUS

## 2024-02-28 MED ORDER — IBUPROFEN 600 MG PO TABS
600.0000 mg | ORAL_TABLET | Freq: Four times a day (QID) | ORAL | Status: DC | PRN
Start: 2024-02-28 — End: 2024-02-29
  Administered 2024-02-28: 600 mg via ORAL
  Filled 2024-02-28: qty 1

## 2024-02-28 MED ORDER — LACTATED RINGERS IV SOLN: INTRAVENOUS | Status: DC

## 2024-02-28 MED ORDER — HYDROMORPHONE HCL 1 MG/ML IJ SOLN: 0.2000 mg | INTRAMUSCULAR | Status: DC | PRN

## 2024-02-28 MED ORDER — BUPIVACAINE HCL (PF) 0.25 % IJ SOLN
INTRAMUSCULAR | Status: AC
  Filled 2024-02-28: qty 30

## 2024-02-28 MED ORDER — OXYCODONE HCL 5 MG/5ML PO SOLN: 5.0000 mg | Freq: Once | ORAL | Status: DC | PRN

## 2024-02-28 MED ORDER — SODIUM CHLORIDE 0.9 % IV SOLN
2.0000 g | Freq: Two times a day (BID) | INTRAVENOUS | Status: AC
  Administered 2024-02-28 – 2024-02-29 (×2): 2 g via INTRAVENOUS
  Filled 2024-02-28 (×2): qty 2

## 2024-02-28 MED ORDER — ONDANSETRON HCL 4 MG/2ML IJ SOLN
4.0000 mg | Freq: Once | INTRAMUSCULAR | Status: DC | PRN
Start: 2024-02-28 — End: 2024-02-28

## 2024-02-28 SURGICAL SUPPLY — 28 items
APPLICATOR COTTON TIP 6 STRL (MISCELLANEOUS) ×1 IMPLANT
DRAPE SURG IRRIG POUCH 19X23 (DRAPES) ×2 IMPLANT
DRSG TELFA 3X8 NADH STRL (GAUZE/BANDAGES/DRESSINGS) ×1 IMPLANT
DURAPREP 26ML APPLICATOR (WOUND CARE) ×2 IMPLANT
FORCEPS CUTTING 45CM 5MM (CUTTING FORCEPS) ×1 IMPLANT
GAUZE SPONGE 2X2 STRL 8-PLY (GAUZE/BANDAGES/DRESSINGS) ×2 IMPLANT
GLOVE BIO SURGEON STRL SZ8 (GLOVE) ×2 IMPLANT
GLOVE BIOGEL PI IND STRL 7.0 (GLOVE) ×2 IMPLANT
GLOVE SURG ORTHO 8.0 STRL STRW (GLOVE) ×2 IMPLANT
GOWN STRL REUS W/ TWL XL LVL3 (GOWN DISPOSABLE) ×3 IMPLANT
IRRIGATION SUCT STRKRFLW 2 WTP (MISCELLANEOUS) ×1 IMPLANT
IV NS 1000ML BAXH (IV SOLUTION) ×1 IMPLANT
KIT PINK PAD W/HEAD ARM REST (MISCELLANEOUS) ×2 IMPLANT
KIT TURNOVER KIT B (KITS) ×2 IMPLANT
NDL INSUFFLATION 14GA 120MM (NEEDLE) ×1 IMPLANT
NEEDLE INSUFFLATION 14GA 120MM (NEEDLE) ×2 IMPLANT
NS IRRIG 1000ML POUR BTL (IV SOLUTION) ×2 IMPLANT
PACK LAPAROSCOPY BASIN (CUSTOM PROCEDURE TRAY) ×2 IMPLANT
SET TUBE SMOKE EVAC HIGH FLOW (TUBING) ×2 IMPLANT
SLEEVE Z-THREAD 5X100MM (TROCAR) ×2 IMPLANT
SOL .9 NS 3000ML IRR UROMATIC (IV SOLUTION) ×2 IMPLANT
SUT VICRYL 0 UR6 27IN ABS (SUTURE) ×2 IMPLANT
SUT VICRYL RAPIDE 3 0 (SUTURE) ×2 IMPLANT
TOWEL GREEN STERILE FF (TOWEL DISPOSABLE) ×4 IMPLANT
TRAY FOLEY W/BAG SLVR 14FR (SET/KITS/TRAYS/PACK) ×1 IMPLANT
TROCAR XCEL DIL TIP R 11M (ENDOMECHANICALS) ×2 IMPLANT
TROCAR Z-THREAD BLADED 11X100M (TROCAR) ×1 IMPLANT
WARMER LAPAROSCOPE (MISCELLANEOUS) ×2 IMPLANT

## 2024-02-28 NOTE — Anesthesia Preprocedure Evaluation (Signed)
 Anesthesia Evaluation  Patient identified by MRN, date of birth, ID band Patient awake  General Assessment Comment:  Hgb 7.2 this AM, down from the 12s last week. Concern for internal bleeding from hemorrhagic ovarian cyst. Patient hemodynamically stable.  Reviewed: Allergy & Precautions, NPO status , Patient's Chart, lab work & pertinent test results  History of Anesthesia Complications (+) PONV and history of anesthetic complications  Airway Mallampati: II  TM Distance: >3 FB Neck ROM: Full    Dental no notable dental hx. (+) Teeth Intact   Pulmonary neg pulmonary ROS, neg sleep apnea, neg COPD, Patient abstained from smoking.Not current smoker   Pulmonary exam normal breath sounds clear to auscultation       Cardiovascular Exercise Tolerance: Good METS(-) hypertension(-) CAD and (-) Past MI negative cardio ROS (-) dysrhythmias  Rhythm:Regular Rate:Normal - Systolic murmurs    Neuro/Psych  Headaches  negative psych ROS   GI/Hepatic ,neg GERD  ,,(+)     (-) substance abuse    Endo/Other  neg diabetes    Renal/GU negative Renal ROS     Musculoskeletal   Abdominal  (+)  Abdomen: tender.   Peds  Hematology  (+) Blood dyscrasia, anemia   Anesthesia Other Findings Past Medical History: No date: AMA (advanced maternal age) multigravida 35+ No date: Family history of adverse reaction to anesthesia     Comment:  mother has nausea No date: Headache     Comment:  Migraines No date: Heart murmur     Comment:  childhood No date: Hx of varicella No date: Hypertension     Comment:  recently noticed increase, no medicatons at this time No date: Palpitations     Comment:  Of questionable significance No date: PONV (postoperative nausea and vomiting) No date: Pre-syncope     Comment:  Probably multifactorial and long standing in duration No date: Umbilical hernia  Reproductive/Obstetrics                               Anesthesia Physical Anesthesia Plan  ASA: 2  Anesthesia Plan: General   Post-op Pain Management: Tylenol  PO (pre-op)*   Induction: Intravenous  PONV Risk Score and Plan: 4 or greater and Ondansetron , Dexamethasone , TIVA, Propofol  infusion and Midazolam   Airway Management Planned: Oral ETT  Additional Equipment: None  Intra-op Plan:   Post-operative Plan: Extubation in OR  Informed Consent: I have reviewed the patients History and Physical, chart, labs and discussed the procedure including the risks, benefits and alternatives for the proposed anesthesia with the patient or authorized representative who has indicated his/her understanding and acceptance.     Dental advisory given  Plan Discussed with: CRNA and Surgeon  Anesthesia Plan Comments: (Patient with severe blood loss anemia, No significant drop over the past 10 hours however. Patient appears in no distress, hemodynamically stable. Will have PRBCs in the room to give if needed. Patient is ok with blood transfusions. Discussed risks of anesthesia with patient, including PONV, sore throat, lip/dental/eye damage. Rare risks discussed as well, such as cardiorespiratory and neurological sequelae, and allergic reactions. Discussed the role of CRNA in patient's perioperative care. Patient understands.)        Anesthesia Quick Evaluation

## 2024-02-28 NOTE — Progress Notes (Signed)
*   Day of Surgery * Procedure(s) (LRB): OOPHORECTOMY, LAPAROSCOPIC (Right)  Subjective: Patient reports tolerating PO.   Pain improved from before surgery  Objective: I have reviewed patient's vital signs, medications, labs, and surgical findings.  GI: soft, non-tender; bowel sounds normal; no masses,  no organomegaly and incision: clean, dry, and intact  Assessment: s/p Procedure(s): OOPHORECTOMY, LAPAROSCOPIC (Right): stable, progressing well, and tolerating diet  Plan: Doing well Advance diet, remove foley Plan to cont IV abx x 24 hours due to past hx of post op infections with surgeries Hgb improved since surgery Recheck in am.  Tolerating anemia from ruptured cyst and hemoperitoneum.     LOS: 1 day    Alm JAYSON Cook, MD 02/28/2024, 10:43 PM

## 2024-02-28 NOTE — Anesthesia Postprocedure Evaluation (Signed)
 Anesthesia Post Note  Patient: Powell KATHEE Epps  Procedure(s) Performed: OOPHORECTOMY, LAPAROSCOPIC (Right: Pelvis)     Patient location during evaluation: PACU Anesthesia Type: General Level of consciousness: awake and alert Pain management: pain level controlled Vital Signs Assessment: post-procedure vital signs reviewed and stable Respiratory status: spontaneous breathing, nonlabored ventilation, respiratory function stable and patient connected to nasal cannula oxygen Cardiovascular status: blood pressure returned to baseline and stable Postop Assessment: no apparent nausea or vomiting Anesthetic complications: no   There were no known notable events for this encounter.  Last Vitals:  Vitals:   02/28/24 1230 02/28/24 1305  BP:  118/66  Pulse:  85  Resp:  20  Temp: 36.6 C 36.9 C  SpO2:  100%    Last Pain:  Vitals:   02/28/24 1305  TempSrc: Oral  PainSc:                  Rome Ade

## 2024-02-28 NOTE — Progress Notes (Signed)
 Patient ID: Kristin Church, female   DOB: 09-21-1975, 48 y.o.   MRN: [id number] Kristin Church had great pain relief with meds.  Still sore Discussed todays surgery VSSAF Hgb .7.2 Abd nd, mildly diffusely tender without rebound  Right ovarian cyst with hemorrhage - Hgb drop to 7.2.  Stable vitals Plan procedure as discussed in H&P Reviewed current labs and answered questions This patient has been seen and examined.   All of her questions were answered.  Labs and vital signs reviewed.  Informed consent has been obtained.  The History and Physical is current. DL

## 2024-02-28 NOTE — Transfer of Care (Signed)
 Immediate Anesthesia Transfer of Care Note  Patient: Kristin Church  Procedure(s) Performed: OOPHORECTOMY, LAPAROSCOPIC (Right)  Patient Location: PACU  Anesthesia Type:General  Level of Consciousness: awake, alert , and oriented  Airway & Oxygen Therapy: Patient Spontanous Breathing  Post-op Assessment: Report given to RN and Post -op Vital signs reviewed and stable  Post vital signs: Reviewed and stable  Last Vitals:  Vitals Value Taken Time  BP 108/60 1205  Temp    Pulse 80 1205  Resp 12 1205  SpO2 97 1205    Last Pain:  Vitals:   02/28/24 0944  TempSrc:   PainSc: 5       Patients Stated Pain Goal: 2 (02/28/24 0944)  Complications: There were no known notable events for this encounter.

## 2024-02-28 NOTE — Anesthesia Procedure Notes (Signed)
 Procedure Name: Intubation Date/Time: 02/28/2024 10:09 AM  Performed by: Christopher Comings, CRNAPre-anesthesia Checklist: Patient identified, Emergency Drugs available, Suction available and Patient being monitored Patient Re-evaluated:Patient Re-evaluated prior to induction Oxygen Delivery Method: Circle system utilized Preoxygenation: Pre-oxygenation with 100% oxygen Induction Type: IV induction Ventilation: Mask ventilation without difficulty Laryngoscope Size: Mac and 3 Grade View: Grade I Tube type: Oral Tube size: 6.5 mm Number of attempts: 1 Airway Equipment and Method: Stylet and Oral airway Placement Confirmation: ETT inserted through vocal cords under direct vision, positive ETCO2 and breath sounds checked- equal and bilateral Secured at: 22 cm Tube secured with: Tape Dental Injury: Teeth and Oropharynx as per pre-operative assessment

## 2024-02-29 DIAGNOSIS — N83291 Other ovarian cyst, right side: Secondary | ICD-10-CM | POA: Diagnosis not present

## 2024-02-29 LAB — CBC
HCT: 20 % — ABNORMAL LOW (ref 36.0–46.0)
HCT: 22.5 % — ABNORMAL LOW (ref 36.0–46.0)
Hemoglobin: 6.6 g/dL — CL (ref 12.0–15.0)
Hemoglobin: 7.4 g/dL — ABNORMAL LOW (ref 12.0–15.0)
MCH: 30.6 pg (ref 26.0–34.0)
MCH: 29.7 pg (ref 26.0–34.0)
MCHC: 33 g/dL (ref 30.0–36.0)
MCHC: 32.9 g/dL (ref 30.0–36.0)
MCV: 92.6 fL (ref 80.0–100.0)
MCV: 90.4 fL (ref 80.0–100.0)
Platelets: 192 K/uL (ref 150–400)
Platelets: 308 K/uL (ref 150–400)
RBC: 2.16 MIL/uL — ABNORMAL LOW (ref 3.87–5.11)
RBC: 2.49 MIL/uL — ABNORMAL LOW (ref 3.87–5.11)
RDW: 12.3 % (ref 11.5–15.5)
RDW: 12.3 % (ref 11.5–15.5)
WBC: 9.9 K/uL (ref 4.0–10.5)
WBC: 13.3 K/uL — ABNORMAL HIGH (ref 4.0–10.5)
nRBC: 0 % (ref 0.0–0.2)
nRBC: 0 % (ref 0.0–0.2)

## 2024-02-29 LAB — SURGICAL PATHOLOGY

## 2024-02-29 MED ORDER — IRON SUCROSE 500 MG IVPB - SIMPLE MED
500.0000 mg | Freq: Once | INTRAVENOUS | Status: DC
  Filled 2024-02-29: qty 275

## 2024-02-29 MED ORDER — FERROUS SULFATE 325 (65 FE) MG PO TABS: 325.0000 mg | ORAL_TABLET | Freq: Every day | ORAL | Status: DC

## 2024-02-29 MED ORDER — SODIUM CHLORIDE 0.9 % IV SOLN
500.0000 mg | Freq: Once | INTRAVENOUS | Status: AC
  Administered 2024-02-29: 500 mg via INTRAVENOUS
  Filled 2024-02-29: qty 25

## 2024-02-29 NOTE — Op Note (Signed)
 NAME: Kristin Church, [name] MEDICAL RECORD NO: [id number] ACCOUNT NO: [id number] DATE OF BIRTH: [birth year] FACILITY: [facility] LOCATION: [facility] PHYSICIAN: Alm BROCKS. Marget, MD  Operative Report   DATE OF PROCEDURE: 02/27/2024  PREOPERATIVE DIAGNOSES: 1. Right lower quadrant pain. 2. Large right ovarian cyst consistent with a hemorrhagic cyst. 3. Anemia. 4. Probable hemoperitoneum.  POSTOPERATIVE DIAGNOSES: 1. Right lower quadrant pain. 2. Large right ovarian cyst consistent with a hemorrhagic cyst. 3. Anemia. 4. hemoperitoneum.  PROCEDURE PERFORMED: Laparoscopy with evacuation of hemoperitoneum and right oophorectomy.  SURGEON: Dr. Alm Marget.  ANESTHESIA: General endotracheal.  INDICATIONS: The patient is a 48 year old with a history of a hysterectomy and removal of the left ovary and bilateral fallopian tubes, presented to [facility] on [facility]/24/[facility]24 with right lower quadrant pain. CAT scan suggested a ruptured right  hemorrhagic cyst. She stayed overnight and had a followup ultrasound, had a hemoglobin of 12.3, and was discharged home. She followed up in my office on 02/27/2024 late afternoon with worsening pain and more diffuse tenderness. Ultrasound showed a 9 x 8  cm hemorrhage near the right ovary. A stat CBC returned with a hemoglobin of 8, so we admitted her to the hospital for further monitoring, evaluation, and potential blood products in anticipation of surgery. We discussed evaluation by laparoscopy,  possible removal of the right ovary. She would like to preserve the ovary if at all possible. I discussed the possibility of laparotomy, blood products, risks associated with anesthesia, risks associated with surgery. All of her questions were answered  and she gave written and informed consent.  FINDINGS: At the time of surgery, a moderate amount of hemoperitoneum up near the liver or down into the pelvis consistent with the hemoperitoneum. The right adnexa showed a large 9 or 10  cm hemorrhagic cyst with clots throughout the pelvis with the  appendix and bowel laying on top of the hemorrhagic cyst. The liver appeared to be normal and after the procedure was over, we did have intraoperative surgical consultation who evaluated the bowel and appendix and feels like there was no significant  damage to them.  DESCRIPTION OF PROCEDURE: After adequate analgesia, the patient was placed in the dorsal lithotomy position. She was sterilely prepped and draped, bladder was sterilely drained with the Foley catheter. A sponge stick was placed in the vagina. The patient  had been given cefepime the night before and also preoperatively 2 g and also 1 g of TXA preoperatively. An infraumbilical skin incision was made. A Veress needle was inserted. The abdomen was insufflated under dullness and percussion. The trocar was  inserted and the laparoscope was inserted, the above findings were then noted. A 5-mm port was placed two fingerbreadths above the pubic symphysis to the left of the midline. A 5-mm port was placed under direct visualization. A suction irrigation was  used to irrigate and suction out the copious amount of clots and blood. Once we were adequately able to visualize the right adnexal area, the large clot had bowel and appendix with the mesoappendix laying on top of the hemorrhage. After irrigation  through the clots, the bowel and appendix fell away from the ovary without any obvious injury or bleeding to the bowel or mesosalpinx or omentum. Evaluation of the ovary, it was free from the pelvic sidewall; however, it continued to have  bleeding throughout the ovary across the hemorrhagic cyst with clots attached to that. It was my opinion that there was no way to stop the bleeding or preserve the  ovary due to the extensive bleeding and clot and destruction of the ovary from that of the  hemorrhage. Gyrus cutting forceps were used to ligate across the infundibulopelvic ligament up to the  round ligament. It was gently dissected and the ovary was removed from the pelvic sidewall with good hemostasis achieved. Care was taken to avoid any  underlying ureter, bowel, and good hemostasis of the IFP and round ligament were noted. The clot and hemorrhage in the ovary were morcellated and removed through the trocar. After copious amount of irrigation, as much clot that could be removed from that  with a total of 750 mL of hemoperitoneum noted after the case. We did place the patient in Trendelenburg and reversed Trendelenburg several times to get the blood and irrigation to the pelvis so that we can adequately suction it out of her abdomen.  Again, after copious amount of irrigation, adequate hemostasis was assured and the right IFP appeared to be normal and hemostatic. We did have intraoperative consultation from a general surgeon who evaluated the appendix, the mesoappendix and the omentum  near where the hemorrhage and clots were and she felt that there was adequate analgesia and no disruption or damage to the bowel or appendix, so no further surgical care was indicated. At this time, the abdomen was then desufflated. The trocars were  removed. The infraumbilical skin incision was closed with a #0 Vicryl interrupted suture in the fascia and a 3-0 Vicryl Rapide subcuticular stitch. The 5-mm site was closed with a #3-0 Vicryl Rapide subcuticular stitch. The incisions were injected with  0.25% Marcaine . A total of 10 mL used. The packing was removed from the vagina. The Foley was left in place.  DISPOSITION: The patient will be kept overnight in the hospital for further evaluation. We will monitor CBCs tonight due to her anemia with consideration of transfusion if indicated and will continue the antibiotics for 24 hours.     SUJ D: 02/28/2024 12:11:05 pm T: 02/29/2024 12:09:00 am  JOB: [id number]/ [id number]

## 2024-02-29 NOTE — Plan of Care (Signed)

## 2024-02-29 NOTE — Discharge Summary (Signed)
 Physician Discharge Summary  Patient ID: Kristin Church MRN: [id number] DOB/AGE: 1975/09/26 48 y.o.  Admit date: 02/27/2024 Discharge date: 02/29/2024  Admission Diagnoses: Anemia, ruptured Right ovarian cyst, hemopeitoneum  Discharge Diagnoses: Same Principal Problem:   Right ovarian cyst Active Problems:   Status post laparoscopy   Discharged Condition: good  Hospital Course: Admitted with above diagnosis, received IVF, ABX and surgery with laparscopic right oopherectomy and evavulation of hemoperitoneum.  Post op anemia was stable and her recovery was excellent.  We did give IV iron  to aid increase in Hgb since she has bowel issues.  She was amubulating well, tolerating a regular diet, bowel and bladder normal and pain well controlled with oral meds.  Consults: general surgery intra operatively to assses bowel  Significant Diagnostic Studies: labs: post of Day 1 7.4  Treatments: IV hydration, antibiotics: cefotetan , surgery: as above, and IV Fe  Discharge Exam: Blood pressure (!) 105/57, pulse 81, temperature 98.5 F (36.9 C), temperature source Oral, resp. rate 18, height 5' 2 (1.575 m), weight 60.8 kg, last menstrual period 05/16/2017, SpO2 99%, unknown if currently breastfeeding. General appearance: alert, cooperative, appears stated age, and mild distress GI: soft, non-tender; bowel sounds normal; no masses,  no organomegaly and incisions clean dry and intact  Disposition: Discharge disposition: 01-Home or Self Care       Discharge Instructions     Call MD for:  difficulty breathing, headache or visual disturbances   Complete by: As directed    Call MD for:  persistant nausea and vomiting   Complete by: As directed    Call MD for:  redness, tenderness, or signs of infection (pain, swelling, redness, odor or green/yellow discharge around incision site)   Complete by: As directed    Call MD for:  severe uncontrolled pain   Complete by: As directed    Call MD for:   temperature >100.4   Complete by: As directed    Diet general   Complete by: As directed    Driving Restrictions   Complete by: As directed    No driving for 2 weeks   Increase activity slowly   Complete by: As directed    Lifting restrictions   Complete by: As directed    No lifting anything greater than 10 pounds (if you have to ask, don't lift it)   No dressing needed   Complete by: As directed    Sexual Activity Restrictions   Complete by: As directed    Nothing in the vagina for 6 weeks          Signed: Alm JAYSON Cook 02/29/2024, 1:39 PM

## 2024-02-29 NOTE — Progress Notes (Signed)
   02/29/24 1702  Departure Condition  Departure Condition Good  Mobility at [facility]  Patient/Caregiver Teaching Teach Back Method Used;Discharge instructions reviewed;Prescriptions reviewed;Follow-up care reviewed;Admission discussed;Pain management discussed;Medications discussed;Patient/caregiver verbalized understanding  Departure Mode With significant other  Was procedural sedation performed on this patient during this visit? Yes   Pt alert and oriented x4, vital signs and pain stable

## 2024-02-29 NOTE — Progress Notes (Signed)
 1 Day Post-Op Procedure(s) (LRB): OOPHORECTOMY, LAPAROSCOPIC (Right)  Subjective: Patient reports tolerating PO, + flatus, and no problems voiding.   No weakness or dizziness   Objective: I have reviewed patient's vital signs, intake and output, medications, and labs.  General: alert, cooperative, appears stated age, and no distress GI: soft, non-tender; bowel sounds normal; no masses,  no organomegaly and incision: clean, dry, and intact  Assessment: s/p Procedure(s): OOPHORECTOMY, LAPAROSCOPIC (Right): stable She is clinically stable and no weakness and no orthostatic hypotension Repeat Hgb reassures stability Pt wants IV Fe since she is concerned about bowel issues with oral Fe Will d/c home with fu in 1 week Doing well on estrogen patch and prn pain meds Plan:  LOS: 1 day    Alm JAYSON Cook, MD 02/29/2024, 1:33 PM

## 2024-03-02 LAB — TYPE AND SCREEN
ABO/RH(D): O POS
Antibody Screen: NEGATIVE
Unit division: 0
Unit division: 0

## 2024-03-02 LAB — BPAM RBC
Blood Product Expiration Date: 202508112359
Blood Product Expiration Date: 202508112359
ISSUE DATE / TIME: 202507160952
ISSUE DATE / TIME: 202507160952
Unit Type and Rh: 5100
Unit Type and Rh: 5100
